# Patient Record
Sex: Female | Born: 1980 | Race: Black or African American | Hispanic: No | Marital: Single | Smoking: Never smoker
Health system: Southern US, Community
[De-identification: ages and names within clinical notes are randomized; demographics above are authoritative.]

## PROBLEM LIST (undated history)

## (undated) DIAGNOSIS — E669 Obesity, unspecified: Secondary | ICD-10-CM

## (undated) HISTORY — PX: WRIST SURGERY: SHX841

## (undated) HISTORY — PX: ANKLE SURGERY: SHX546

---
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
## 2003-01-21 ENCOUNTER — Emergency Department (HOSPITAL_COMMUNITY): Admission: EM | Admit: 2003-01-21 | Discharge: 2003-01-21 | Payer: Self-pay | Admitting: Emergency Medicine

## 2003-01-21 ENCOUNTER — Encounter: Payer: Self-pay | Admitting: Emergency Medicine

## 2003-08-05 ENCOUNTER — Emergency Department (HOSPITAL_COMMUNITY): Admission: EM | Admit: 2003-08-05 | Discharge: 2003-08-05 | Payer: Self-pay | Admitting: Emergency Medicine

## 2003-09-29 ENCOUNTER — Emergency Department (HOSPITAL_COMMUNITY): Admission: EM | Admit: 2003-09-29 | Discharge: 2003-09-30 | Payer: Self-pay | Admitting: Emergency Medicine

## 2003-10-01 ENCOUNTER — Inpatient Hospital Stay (HOSPITAL_COMMUNITY): Admission: EM | Admit: 2003-10-01 | Discharge: 2003-10-03 | Payer: Self-pay | Admitting: Emergency Medicine

## 2004-08-05 ENCOUNTER — Emergency Department (HOSPITAL_COMMUNITY): Admission: EM | Admit: 2004-08-05 | Discharge: 2004-08-05 | Payer: Self-pay | Admitting: Emergency Medicine

## 2004-08-21 ENCOUNTER — Encounter: Admission: RE | Admit: 2004-08-21 | Discharge: 2004-08-21 | Payer: Self-pay | Admitting: Gastroenterology

## 2005-01-12 ENCOUNTER — Emergency Department (HOSPITAL_COMMUNITY): Admission: EM | Admit: 2005-01-12 | Discharge: 2005-01-12 | Payer: Self-pay | Admitting: Emergency Medicine

## 2005-04-01 ENCOUNTER — Emergency Department (HOSPITAL_COMMUNITY): Admission: EM | Admit: 2005-04-01 | Discharge: 2005-04-01 | Payer: Self-pay | Admitting: Family Medicine

## 2006-03-30 ENCOUNTER — Emergency Department (HOSPITAL_COMMUNITY): Admission: EM | Admit: 2006-03-30 | Discharge: 2006-03-30 | Payer: Self-pay | Admitting: Emergency Medicine

## 2006-04-30 ENCOUNTER — Emergency Department (HOSPITAL_COMMUNITY): Admission: EM | Admit: 2006-04-30 | Discharge: 2006-04-30 | Payer: Self-pay | Admitting: Emergency Medicine

## 2006-05-25 ENCOUNTER — Emergency Department (HOSPITAL_COMMUNITY): Admission: EM | Admit: 2006-05-25 | Discharge: 2006-05-25 | Payer: Self-pay | Admitting: Emergency Medicine

## 2007-10-13 ENCOUNTER — Ambulatory Visit (HOSPITAL_COMMUNITY): Admission: RE | Admit: 2007-10-13 | Discharge: 2007-10-13 | Payer: Self-pay | Admitting: Obstetrics & Gynecology

## 2009-06-01 ENCOUNTER — Emergency Department (HOSPITAL_COMMUNITY): Admission: EM | Admit: 2009-06-01 | Discharge: 2009-06-01 | Payer: Self-pay | Admitting: Emergency Medicine

## 2009-10-21 ENCOUNTER — Emergency Department (HOSPITAL_COMMUNITY): Admission: EM | Admit: 2009-10-21 | Discharge: 2009-10-21 | Payer: Self-pay | Admitting: Emergency Medicine

## 2010-07-19 ENCOUNTER — Emergency Department (HOSPITAL_COMMUNITY)
Admission: EM | Admit: 2010-07-19 | Discharge: 2010-07-19 | Disposition: A | Discharge status: Home / Self Care | Payer: Medicaid Other | Attending: Emergency Medicine | Admitting: Emergency Medicine

## 2010-07-19 ENCOUNTER — Emergency Department (HOSPITAL_COMMUNITY): Payer: Medicaid Other

## 2010-07-19 DIAGNOSIS — R51 Headache: Secondary | ICD-10-CM | POA: Insufficient documentation

## 2010-07-19 DIAGNOSIS — J069 Acute upper respiratory infection, unspecified: Secondary | ICD-10-CM | POA: Insufficient documentation

## 2010-07-19 DIAGNOSIS — R059 Cough, unspecified: Secondary | ICD-10-CM | POA: Insufficient documentation

## 2010-07-19 DIAGNOSIS — R05 Cough: Secondary | ICD-10-CM | POA: Insufficient documentation

## 2010-07-28 ENCOUNTER — Emergency Department (HOSPITAL_COMMUNITY)
Admission: EM | Admit: 2010-07-28 | Discharge: 2010-07-29 | Disposition: A | Discharge status: Home / Self Care | Payer: Medicaid Other | Attending: Emergency Medicine | Admitting: Emergency Medicine

## 2010-07-28 ENCOUNTER — Emergency Department (HOSPITAL_COMMUNITY): Payer: Medicaid Other

## 2010-07-28 DIAGNOSIS — R062 Wheezing: Secondary | ICD-10-CM | POA: Insufficient documentation

## 2010-07-28 DIAGNOSIS — J40 Bronchitis, not specified as acute or chronic: Secondary | ICD-10-CM | POA: Insufficient documentation

## 2010-07-28 DIAGNOSIS — J069 Acute upper respiratory infection, unspecified: Secondary | ICD-10-CM | POA: Insufficient documentation

## 2010-08-12 LAB — URINALYSIS, ROUTINE W REFLEX MICROSCOPIC
Glucose, UA: NEGATIVE mg/dL
Hgb urine dipstick: NEGATIVE
Protein, ur: NEGATIVE mg/dL
Urobilinogen, UA: 1 mg/dL (ref 0.0–1.0)
pH: 6 (ref 5.0–8.0)

## 2010-08-12 LAB — URINE MICROSCOPIC-ADD ON

## 2010-10-11 NOTE — Discharge Summary (Signed)
Rachel Burke, Rachel Burke                          ACCOUNT NO.:  [id number]   MEDICAL RECORD NO.:  [id number]                   PATIENT TYPE:  INP   LOCATION:  0365                                 FACILITY:  [facility]   PHYSICIAN:  Melissa L. Ladona Ridgel, MD               DATE OF BIRTH:  March 25, 1981   DATE OF ADMISSION:  09/30/2003  DATE OF DISCHARGE:  10/03/2003                                 DISCHARGE SUMMARY   ADMITTING DIAGNOSES:  1. Back pain.  2. Dizziness.  3. Fever.   DISCHARGE DIAGNOSES:  1. Pyelonephritis, being treated with Cipro.  2. Vaginitis.   HISTORY OF PRESENT ILLNESS:  The patient is a 30 year old female with no  significant past medical history who presented to the emergency room on the  day prior to admission for back pain.  She was treated with oral Cipro for a  UTI; however, her pain increased, and she was unable to eat and became  nauseous.  She returned to the emergency room and was admitted for  pyelonephritis, which was determined on clinical examination, despite  negative CT for renal stone.  Of note, the patient does have an IUD in her  uterus.  She underwent a pelvic examination in the emergency room, and the  only finding was significant for clue cells with mild vaginal spotting.  The  patient states that  her period did discontinue several days prior to  becoming ill.  The patient was admitted to the general medical floor and  treated with IV Cipro and rehydrated.  She responded well to pain medication  and rehydration, and it was determined that her UTI was secondary to a  pansensitive E. coli.  Therefore, once the patient was able to take oral  antibiotics and oral medications, she was found stable for discharge to home  with follow up through her primary care physician.  She was given a  prescription for Cipro 500 mg p.o.  q.12h. to complete the course of her  medical therapy, and there were no other discharge needs at the time of  discharge.  Her condition  at discharge was stable.   LABORATORY DATA:  Pertinent laboratory values during the course of the  hospital revealed mild hypokalemia of 3.1, which was repleted.  Her  admitting white count was found to be 12.5.  The white count trended down to  7.9.  On the day prior to discharge, her hemoglobin and hematocrit were  stable at 10.6 and 31.7.  She underwent an ultrasound to rule out  hydronephrosis on the second day when her back pain did not improve, and  this was negative.   PHYSICAL EXAMINATION:  GENERAL:  Her physical exam remained relatively  benign.  Generally, she was in no acute distress.  HEENT:  Pupils equal, round and reactive to light.  Extraocular muscles were  intact.  CHEST:  Clear to auscultation.  CARDIOVASCULAR:  Regular  rate and rhythm.  Positive S1 and S2.  No S3 or S4.  ABDOMEN:  Soft, nontender, but she did have some right flank tenderness.  EXTREMITIES:  No edema.  She had 2+ pulses.  NEUROLOGIC:  She was nonfocal.   CONDITION AT DISCHARGE:  Stable.                                               Melissa L. Ladona Ridgel, MD    MLT/MEDQ  D:  10/14/2003  T:  10/14/2003  Job:  [id number]

## 2010-12-02 ENCOUNTER — Other Ambulatory Visit: Payer: Self-pay | Admitting: Family Medicine

## 2010-12-02 ENCOUNTER — Ambulatory Visit
Admission: RE | Admit: 2010-12-02 | Discharge: 2010-12-02 | Disposition: A | Discharge status: Home / Self Care | Payer: Medicaid Other | Source: Ambulatory Visit | Attending: Family Medicine | Admitting: Family Medicine

## 2010-12-02 DIAGNOSIS — R1032 Left lower quadrant pain: Secondary | ICD-10-CM

## 2010-12-04 ENCOUNTER — Other Ambulatory Visit: Payer: Self-pay | Admitting: Family Medicine

## 2010-12-04 DIAGNOSIS — R102 Pelvic and perineal pain: Secondary | ICD-10-CM

## 2010-12-06 ENCOUNTER — Ambulatory Visit
Admission: RE | Admit: 2010-12-06 | Discharge: 2010-12-06 | Disposition: A | Discharge status: Home / Self Care | Payer: Medicaid Other | Source: Ambulatory Visit | Attending: Family Medicine | Admitting: Family Medicine

## 2010-12-06 ENCOUNTER — Other Ambulatory Visit: Payer: Self-pay | Admitting: Family Medicine

## 2010-12-06 DIAGNOSIS — R102 Pelvic and perineal pain: Secondary | ICD-10-CM

## 2011-10-17 ENCOUNTER — Encounter (HOSPITAL_COMMUNITY): Payer: Self-pay | Admitting: Emergency Medicine

## 2011-10-17 ENCOUNTER — Emergency Department (HOSPITAL_COMMUNITY)
Admission: EM | Admit: 2011-10-17 | Discharge: 2011-10-17 | Disposition: A | Discharge status: Home Health | Payer: Medicaid Other | Attending: Emergency Medicine | Admitting: Emergency Medicine

## 2011-10-17 DIAGNOSIS — A5901 Trichomonal vulvovaginitis: Secondary | ICD-10-CM

## 2011-10-17 DIAGNOSIS — N72 Inflammatory disease of cervix uteri: Secondary | ICD-10-CM | POA: Insufficient documentation

## 2011-10-17 LAB — BASIC METABOLIC PANEL
BUN: 10 mg/dL (ref 6–23)
Calcium: 9.5 mg/dL (ref 8.4–10.5)
GFR calc non Af Amer: 89 mL/min — ABNORMAL LOW (ref >90)
Glucose, Bld: 85 mg/dL (ref 70–99)
Sodium: 138 mEq/L (ref 135–145)

## 2011-10-17 LAB — CBC
Hemoglobin: 12.3 g/dL (ref 12.0–15.0)
MCH: 26.6 pg (ref 26.0–34.0)
MCHC: 33.3 g/dL (ref 30.0–36.0)
MCV: 79.7 fL (ref 78.0–100.0)
Platelets: 348 10*3/uL (ref 150–400)
RDW: 15.1 % (ref 11.5–15.5)

## 2011-10-17 LAB — URINALYSIS, ROUTINE W REFLEX MICROSCOPIC
Bilirubin Urine: NEGATIVE
Glucose, UA: NEGATIVE mg/dL
Hgb urine dipstick: NEGATIVE
Nitrite: NEGATIVE
Urobilinogen, UA: 0.2 mg/dL (ref 0.0–1.0)
pH: 7.5 (ref 5.0–8.0)

## 2011-10-17 LAB — DIFFERENTIAL
Eosinophils Relative: 2 % (ref 0–5)
Lymphocytes Relative: 29 % (ref 12–46)
Lymphs Abs: 2.4 10*3/uL (ref 0.7–4.0)
Monocytes Relative: 10 % (ref 3–12)
Neutro Abs: 5 10*3/uL (ref 1.7–7.7)
Neutrophils Relative %: 59 % (ref 43–77)

## 2011-10-17 MED ORDER — HYDROCODONE-ACETAMINOPHEN 5-325 MG PO TABS: 1.0000 | ORAL_TABLET | Freq: Four times a day (QID) | ORAL | Status: DC | PRN

## 2011-10-17 MED ORDER — METRONIDAZOLE 500 MG PO TABS: 500.0000 mg | ORAL_TABLET | Freq: Two times a day (BID) | ORAL | Status: DC

## 2011-10-17 MED ORDER — SODIUM CHLORIDE 0.9 % IV SOLN: 1000.0000 mL | INTRAVENOUS | Status: DC

## 2011-10-17 MED ORDER — CEFTRIAXONE SODIUM 250 MG IJ SOLR
250.0000 mg | Freq: Once | INTRAMUSCULAR | Status: AC
  Administered 2011-10-17: 250 mg via INTRAMUSCULAR
  Filled 2011-10-17: qty 250

## 2011-10-17 MED ORDER — DOXYCYCLINE HYCLATE 100 MG PO CAPS: 100.0000 mg | ORAL_CAPSULE | Freq: Two times a day (BID) | ORAL | Status: DC

## 2011-10-17 MED ORDER — NAPROXEN 500 MG PO TABS: 500.0000 mg | ORAL_TABLET | Freq: Two times a day (BID) | ORAL | Status: DC

## 2011-10-17 MED ORDER — SODIUM CHLORIDE 0.9 % IV SOLN
1000.0000 mL | Freq: Once | INTRAVENOUS | Status: AC
  Administered 2011-10-17: 1000 mL via INTRAVENOUS

## 2011-10-17 NOTE — Discharge Instructions (Signed)
Cervicitis   Cervicitis is a soreness and swelling (inflammation) of the cervix. Your cervix is located at the bottom of your uterus which opens up to the vagina.   CAUSES   Sexually transmitted infections (STIs).   Allergic reaction.   Medicines or birth control devices that are put in the vagina.   Injury to the cervix.   Bacterial infections.   SYMPTOMS   There may be no symptoms. If symptoms occur, they may include:   Grey, white, yellow, or bad smelling vaginal discharge.   Pain or itching of the area outside the vagina.   Painful sexual intercourse.   Lower abdominal or lower back pain, especially during intercourse.   Frequent urination.   Abnormal vaginal bleeding between periods, after sexual intercourse, or after menopause.   Pressure or a heavy feeling in the pelvis.   DIAGNOSIS   Diagnosis is made after a pelvic exam. Other tests may include:   Examination of any discharge under a microscope (wet prep).   A Pap test.   TREATMENT   Treatment will depend on the cause of cervicitis. If it is caused by an STI, both you and your partner will need to be treated. Antibiotic medicines will be given.   HOME CARE INSTRUCTIONS   Do not have sexual intercourse until your caregiver says it is okay.   Do not have sexual intercourse until your partner has been treated if your cervicitis is caused by an STI.   Take your antibiotics as directed. Finish them even if you start to feel better.   SEEK IMMEDIATE MEDICAL CARE IF:   Your symptoms come back.   You have a fever.   You experience any problems that may be related to the medicine you are taking.   MAKE SURE YOU:   Understand these instructions.   Will watch your condition.   Will get help right away if you are not doing well or get worse.   Document Released: 05/12/2005 Document Revised: 05/01/2011 Document Reviewed: 12/09/2010   Exit[employer] Patient Information 2012 [employer].

## 2011-10-17 NOTE — ED Notes (Signed)
Pt of LLQ beginning 2 days ago, pain does not move and is described as sharp-shooting pain increases with movement. No nausea, vomiting or diarrhea.

## 2011-10-17 NOTE — ED Provider Notes (Signed)
History     CSN: [id number]  Arrival date & time 10/17/11  0930   First MD Initiated Contact with Patient 10/17/11 [phone]      No chief complaint on file.   (Consider location/radiation/quality/duration/timing/severity/associated sxs/prior treatment) Patient is a 31 y.o. female presenting with abdominal pain. The history is provided by the patient.  Abdominal Pain The primary symptoms of the illness include abdominal pain and dysuria. The primary symptoms of the illness do not include fever, nausea, vomiting, diarrhea, vaginal discharge or vaginal bleeding. The current episode started 2 days ago. The onset of the illness was gradual. The problem has not changed since onset. The abdominal pain is located in the LLQ. The abdominal pain radiates to the RLQ. Pain scale: moderate but at times will be more severe, waxing and waning.  The patient states that she believes she is currently not pregnant. Symptoms associated with the illness do not include chills.    No past medical history on file.  No past surgical history on file.  No family history on file.  History  Substance Use Topics  . Smoking status: Not on file  . Smokeless tobacco: Not on file  . Alcohol Use: Not on file    OB History    No data available      Review of Systems  Constitutional: Negative for fever and chills.  Gastrointestinal: Positive for abdominal pain. Negative for nausea, vomiting and diarrhea.  Genitourinary: Positive for dysuria. Negative for vaginal bleeding and vaginal discharge.  All other systems reviewed and are negative.    Allergies  Review of patient's allergies indicates not on file.  Home Medications  No current outpatient prescriptions on file.  There were no vitals taken for this visit.  Physical Exam  Nursing note and vitals reviewed. Constitutional: She appears well-developed and well-nourished. No distress.  HENT:  Head: Normocephalic and atraumatic.  Right Ear: External  ear normal.  Left Ear: External ear normal.  Eyes: Conjunctivae are normal. Right eye exhibits no discharge. Left eye exhibits no discharge. No scleral icterus.  Neck: Neck supple. No tracheal deviation present.  Cardiovascular: Normal rate, regular rhythm and intact distal pulses.   Pulmonary/Chest: Effort normal and breath sounds normal. No stridor. No respiratory distress. She has no wheezes. She has no rales.  Abdominal: Soft. Bowel sounds are normal. She exhibits no distension. There is no tenderness. There is no rebound and no guarding.  Genitourinary: Uterus is tender. Cervix exhibits motion tenderness. Left adnexum displays tenderness. Left adnexum displays no mass and no fullness. No bleeding around the vagina. Vaginal discharge found.  Musculoskeletal: She exhibits no edema and no tenderness.  Neurological: She is alert. She has normal strength. No sensory deficit. Cranial nerve deficit:  no gross defecits noted. She exhibits normal muscle tone. She displays no seizure activity. Coordination normal.  Skin: Skin is warm and dry. No rash noted.  Psychiatric: She has a normal mood and affect.    ED Course  Procedures (including critical care time)  Labs Reviewed  URINALYSIS, ROUTINE W REFLEX MICROSCOPIC - Abnormal; Notable for the following:    Leukocytes, UA TRACE (*)    All other components within normal limits  BASIC METABOLIC PANEL - Abnormal; Notable for the following:    GFR calc non Af Amer 89 (*)    All other components within normal limits  URINE MICROSCOPIC-ADD ON - Abnormal; Notable for the following:    Bacteria, UA MANY (*)    All other components within  normal limits  CBC  DIFFERENTIAL  POCT PREGNANCY, URINE  GC/CHLAMYDIA PROBE AMP, GENITAL   No results found.   MDM   Pt with cervicitis.  Will dc home with PO abx.  Precautions discussed.       Celene Kras, MD 10/17/11 [phone]

## 2011-10-18 LAB — GC/CHLAMYDIA PROBE AMP, GENITAL
Chlamydia, DNA Probe: NEGATIVE
GC Probe Amp, Genital: NEGATIVE

## 2011-10-23 ENCOUNTER — Emergency Department (HOSPITAL_COMMUNITY)
Admission: EM | Admit: 2011-10-23 | Discharge: 2011-10-23 | Disposition: A | Discharge status: Home / Self Care | Payer: Medicaid Other | Source: Home / Self Care | Attending: Emergency Medicine | Admitting: Emergency Medicine

## 2011-10-23 ENCOUNTER — Encounter (HOSPITAL_COMMUNITY): Payer: Self-pay | Admitting: Emergency Medicine

## 2011-10-23 DIAGNOSIS — K208 Other esophagitis without bleeding: Secondary | ICD-10-CM

## 2011-10-23 HISTORY — DX: Obesity, unspecified: E66.9

## 2011-10-23 MED ORDER — GI COCKTAIL ~~LOC~~
ORAL | Status: AC
  Filled 2011-10-23: qty 30

## 2011-10-23 MED ORDER — GI COCKTAIL ~~LOC~~
30.0000 mL | Freq: Once | ORAL | Status: AC
  Administered 2011-10-23: 30 mL via ORAL

## 2011-10-23 MED ORDER — ALUM & MAG HYDROXIDE-SIMETH 200-200-20 MG/5ML PO SUSP: 5.0000 mL | Freq: Four times a day (QID) | ORAL | Status: AC | PRN

## 2011-10-23 MED ORDER — SUCRALFATE 1 GM/10ML PO SUSP: 1.0000 g | Freq: Four times a day (QID) | ORAL | Status: AC

## 2011-10-23 MED ORDER — DIPHENHYDRAMINE HCL 12.5 MG/5ML PO LIQD: 12.5000 mg | Freq: Four times a day (QID) | ORAL | Status: AC | PRN

## 2011-10-23 NOTE — ED Notes (Signed)
Patient reports seen at [facility] long ed on 5/24.2013 and diagnoses with cervicitis.  Discharged with 2 antibiotics.  Patient reports 2 days ago noticing discomfort in center chest, through to back.  Denies any choking episodes, described as "trouble swallowing".  Pain is dull with swallowing saliva, but sharp pain with swallowing food or liquids.  Patient also reports a red tinted vaginal discharge.  Says she stopped antibiotics 2 days ago, discharge stopped, sensation in chest has not improved.

## 2011-10-23 NOTE — ED Provider Notes (Signed)
History     CSN: [id number]  Arrival date & time 10/23/11  1341   First MD Initiated Contact with Patient 10/23/11 1404      Chief Complaint  Patient presents with  . Dysphagia    (Consider location/radiation/quality/duration/timing/severity/associated sxs/prior treatment) HPI Comments: Patient presents with dysphagia starting 2 days ago. Reports sharp pain when swallowing. Otherwise complains of a dull, burning sensation when swallowing saliva. States that solids hurt more than liquids, but that she has pain with swallowing all the time. No nausea, vomiting, voice changes, regurgitation, drooling. No other CP, SOB, abdominal pain. She was seen on 5/24 with abdominal pain and dysuria. She was thought to have cervicitis. Gonorrhea, Chlamydia were negative. She sent home on doxycycline, Flagyl, NSAIDs, Norco. States that was taking the doxycycline and Flagyl but stopped 2 days ago. She states that she is not gotten any better since she stopped taking the pills. Patient has no known history of reflux. She is not a smoker. She is obese.  ROS as noted in HPI. All other ROS negative.   The history is provided by the patient. No language interpreter was used.    Past Medical History  Diagnosis Date  . Obesity     Past Surgical History  Procedure Date  . Cesarean section     Family History  Problem Relation Age of Onset  . Cancer Mother   . Hypertension Mother   . Thyroid disease Mother   . Asthma Father     History  Substance Use Topics  . Smoking status: Never Smoker   . Smokeless tobacco: Never Used  . Alcohol Use: No    OB History    Grav Para Term Preterm Abortions TAB SAB Ect Mult Living                  Review of Systems  Allergies  Review of patient's allergies indicates no known allergies.  Home Medications   Current Outpatient Rx  Name Route Sig Dispense Refill  . ALUM & MAG HYDROXIDE-SIMETH 200-200-20 MG/5ML PO SUSP Oral Take 5 mLs by mouth 4 (four)  times daily as needed. Mix 5 mL with 5 mL of benadryl. Take 4-6 hr prn pain 150 mL 0  . DIPHENHYDRAMINE HCL 12.5 MG/5ML PO LIQD Oral Take 5 mLs (12.5 mg total) by mouth 4 (four) times daily as needed. Mix 5 mL with 5 mL of Maalox. Hold in mouth and swallow. Take the 10 mL q 4-6 hr prn pain 120 mL 0  . SUCRALFATE 1 GM/10ML PO SUSP Oral Take 10 mLs (1 g total) by mouth 4 (four) times daily. 10 mL before meals and before bedtime 240 mL 0    BP 115/49  Pulse 86  Temp(Src) 97.8 F (36.6 C) (Oral)  Resp 18  SpO2 97%  LMP 09/24/2011  Physical Exam  Nursing note and vitals reviewed. Constitutional: She is oriented to person, place, and time. She appears well-developed and well-nourished. No distress.  HENT:  Head: Normocephalic and atraumatic.  Mouth/Throat: Oropharynx is clear and moist.  Eyes: Conjunctivae and EOM are normal.  Neck: Normal range of motion.  Cardiovascular: Normal rate, regular rhythm and normal heart sounds.   Pulmonary/Chest: Effort normal and breath sounds normal.  Abdominal: Soft. Bowel sounds are normal. She exhibits no distension. There is no tenderness.  Musculoskeletal: Normal range of motion.  Neurological: She is alert and oriented to person, place, and time.  Skin: Skin is warm and dry.  Psychiatric: She has  a normal mood and affect. Her behavior is normal. Judgment and thought content normal.    ED Course  Procedures (including critical care time)  Labs Reviewed - No data to display No results found.   1. Pill esophagitis       MDM  Previous records, labs reviewed. GC chalmhydia neg.  advised that it was ok to stop the doxycycline. No wet prep available but the patient states that she has no further vaginal discharge. H& P most c/w pill espohagitis. Gave GI cocktail here. Will send home with Benadryl/Maalox mix, and Carafate. Discussed that it may take several days for her to fully recover. She will followup with her primary care physician as  needed.  Luiz Blare, MD 10/23/11 1538

## 2011-10-23 NOTE — Discharge Instructions (Signed)
Take the medication as written. Return for fever >100.4, if your pain changes, or for any other concerns.

## 2013-04-14 ENCOUNTER — Ambulatory Visit: Payer: Self-pay | Admitting: Obstetrics & Gynecology

## 2013-05-05 ENCOUNTER — Ambulatory Visit: Payer: Medicaid Other | Admitting: Obstetrics & Gynecology

## 2014-05-22 ENCOUNTER — Encounter: Payer: Self-pay | Admitting: *Deleted

## 2016-04-10 ENCOUNTER — Other Ambulatory Visit: Payer: Medicaid Other

## 2016-04-10 ENCOUNTER — Other Ambulatory Visit: Payer: Self-pay | Admitting: Family Medicine

## 2016-04-10 ENCOUNTER — Ambulatory Visit
Admission: RE | Admit: 2016-04-10 | Discharge: 2016-04-10 | Disposition: A | Discharge status: Home / Self Care | Payer: BLUE CROSS/BLUE SHIELD | Source: Ambulatory Visit | Attending: Family Medicine | Admitting: Family Medicine

## 2016-04-10 DIAGNOSIS — R52 Pain, unspecified: Secondary | ICD-10-CM

## 2016-04-10 DIAGNOSIS — R1011 Right upper quadrant pain: Secondary | ICD-10-CM

## 2016-04-10 DIAGNOSIS — R0602 Shortness of breath: Secondary | ICD-10-CM

## 2016-04-28 ENCOUNTER — Emergency Department (HOSPITAL_COMMUNITY)
Admission: EM | Admit: 2016-04-28 | Discharge: 2016-04-29 | Disposition: A | Discharge status: Home / Self Care | Payer: BLUE CROSS/BLUE SHIELD | Attending: Emergency Medicine | Admitting: Emergency Medicine

## 2016-04-28 ENCOUNTER — Encounter (HOSPITAL_COMMUNITY): Payer: Self-pay | Admitting: Emergency Medicine

## 2016-04-28 DIAGNOSIS — Z79899 Other long term (current) drug therapy: Secondary | ICD-10-CM | POA: Insufficient documentation

## 2016-04-28 DIAGNOSIS — R1011 Right upper quadrant pain: Secondary | ICD-10-CM | POA: Diagnosis not present

## 2016-04-28 DIAGNOSIS — R109 Unspecified abdominal pain: Secondary | ICD-10-CM

## 2016-04-28 LAB — COMPREHENSIVE METABOLIC PANEL
ALBUMIN: 3.8 g/dL (ref 3.5–5.0)
ALK PHOS: 66 U/L (ref 38–126)
ALT: 13 U/L — AB (ref 14–54)
AST: 16 U/L (ref 15–41)
Anion gap: 8 (ref 5–15)
BUN: 11 mg/dL (ref 6–20)
CALCIUM: 8.6 mg/dL — AB (ref 8.9–10.3)
CO2: 25 mmol/L (ref 22–32)
CREATININE: 0.82 mg/dL (ref 0.44–1.00)
Chloride: 108 mmol/L (ref 101–111)
GFR calc Af Amer: >60 mL/min (ref >60)
GFR calc non Af Amer: >60 mL/min (ref >60)
GLUCOSE: 99 mg/dL (ref 65–99)
Potassium: 3.7 mmol/L (ref 3.5–5.1)
SODIUM: 141 mmol/L (ref 135–145)
Total Bilirubin: 0.8 mg/dL (ref 0.3–1.2)
Total Protein: 7.1 g/dL (ref 6.5–8.1)

## 2016-04-28 LAB — I-STAT BETA HCG BLOOD, ED (MC, WL, AP ONLY)

## 2016-04-28 LAB — URINALYSIS, ROUTINE W REFLEX MICROSCOPIC
BILIRUBIN URINE: NEGATIVE
GLUCOSE, UA: NEGATIVE mg/dL
HGB URINE DIPSTICK: NEGATIVE
Ketones, ur: NEGATIVE mg/dL
Leukocytes, UA: NEGATIVE
Nitrite: NEGATIVE
PROTEIN: NEGATIVE mg/dL
SPECIFIC GRAVITY, URINE: 1.012 (ref 1.005–1.030)
pH: 6.5 (ref 5.0–8.0)

## 2016-04-28 LAB — CBC
HCT: 35.2 % — ABNORMAL LOW (ref 36.0–46.0)
Hemoglobin: 11.6 g/dL — ABNORMAL LOW (ref 12.0–15.0)
MCH: 26.9 pg (ref 26.0–34.0)
MCHC: 33 g/dL (ref 30.0–36.0)
MCV: 81.7 fL (ref 78.0–100.0)
PLATELETS: 355 10*3/uL (ref 150–400)
RBC: 4.31 MIL/uL (ref 3.87–5.11)
RDW: 15.2 % (ref 11.5–15.5)
WBC: 7 10*3/uL (ref 4.0–10.5)

## 2016-04-28 LAB — LIPASE, BLOOD: Lipase: 32 U/L (ref 11–51)

## 2016-04-28 MED ORDER — DICYCLOMINE HCL 10 MG PO CAPS
10.0000 mg | ORAL_CAPSULE | Freq: Once | ORAL | Status: AC
  Administered 2016-04-28: 10 mg via ORAL
  Filled 2016-04-28: qty 1

## 2016-04-28 NOTE — ED Triage Notes (Signed)
Pt presents with right sided abdominal pain that began 3 weeks ago.  She was seen by her PCP and urgent care at that time and was prescribed naproxen, tylenol 3, antibiotics, and a muscle relaxer and she had some relief.  They did x-rays and stated there was some spots that could be indicative of pneumonia so they wanted to give her antibiotics prophylactic. Since running out pain has returned.  Denies N&V or diarrhea. A&O.

## 2016-04-28 NOTE — ED Provider Notes (Signed)
[facility]-EMERGENCY DEPT Provider Note   CSN: [id number] Arrival date & time: 04/28/16  2004     History   Chief Complaint Chief Complaint  Patient presents with  . Abdominal Pain    HPI Rachel Burke is a 35 y.o. female.  The history is provided by the patient. No language interpreter was used.  Abdominal Pain      Rachel Burke is a 35 y.o. female who presents to the Emergency Department complaining of abdominal pain.  She initially developed right sided abdominal pain just before thanksgiving.  Pain is sharp and worse with movement. Sxs were present for about one week at that time and she saw her PCP and had an xray performed and was treated with naproxen, T3, zpackFor questionable pneumonia.  She was well for a week only to experience recurrent sxs three days ago.  She denies fever, cough, vomiting, dysuria, discharge, diarrhea, constipation, leg swelling.  Her abdominal pain is worse with deep breaths. She does not take any exogenous estrogen.  Past Medical History:  Diagnosis Date  . Obesity     There are no active problems to display for this patient.   Past Surgical History:  Procedure Laterality Date  . CESAREAN SECTION      OB History    No data available       Home Medications    Prior to Admission medications   Medication Sig Start Date End Date Taking? Authorizing Provider  diphenhydrAMINE (BENADRYL) 12.5 MG/5ML liquid Take 5 mLs (12.5 mg total) by mouth 4 (four) times daily as needed. Mix 5 mL with 5 mL of Maalox. Hold in mouth and swallow. Take the 10 mL q 4-6 hr prn pain 10/23/11 11/02/11  Domenick GongAshley Mortenson, MD  ibuprofen (ADVIL,MOTRIN) 800 MG tablet Take 1 tablet (800 mg total) by mouth 3 (three) times daily. 04/29/16   Tilden FossaElizabeth Alexx Giambra, MD  sucralfate (CARAFATE) 1 GM/10ML suspension Take 10 mLs (1 g total) by mouth 4 (four) times daily. 10 mL before meals and before bedtime 10/23/11 11/22/11  Domenick GongAshley Mortenson, MD    Family History Family History  Problem  Relation Age of Onset  . Cancer Mother   . Hypertension Mother   . Thyroid disease Mother   . Asthma Father     Social History Social History  Substance Use Topics  . Smoking status: Never Smoker  . Smokeless tobacco: Never Used  . Alcohol use No     Allergies   Patient has no known allergies.   Review of Systems Review of Systems  Gastrointestinal: Positive for abdominal pain.  All other systems reviewed and are negative.    Physical Exam Updated Vital Signs BP 100/62 (BP Location: Right Arm) Comment (BP Location): Lower   Pulse 64   Temp 98 F (36.7 C) (Oral)   Resp 18   Ht 5\' 5"  (1.651 m)   Wt (!) 310 lb (140.6 kg)   LMP 04/16/2016   SpO2 100%   BMI 51.59 kg/m   Physical Exam  Constitutional: She is oriented to person, place, and time. She appears well-developed and well-nourished.  HENT:  Head: Normocephalic and atraumatic.  Cardiovascular: Normal rate and regular rhythm.   No murmur heard. Pulmonary/Chest: Effort normal and breath sounds normal. No respiratory distress.  Abdominal: Soft. There is no rebound and no guarding.  Mild to moderate RUQ tenderness  Musculoskeletal: She exhibits no edema or tenderness.  Neurological: She is alert and oriented to person, place, and time.  Skin: Skin is  warm and dry.  Psychiatric: She has a normal mood and affect. Her behavior is normal.  Nursing note and vitals reviewed.    ED Treatments / Results  Labs (all labs ordered are listed, but only abnormal results are displayed) Labs Reviewed  COMPREHENSIVE METABOLIC PANEL - Abnormal; Notable for the following:       Result Value   Calcium 8.6 (*)    ALT 13 (*)    All other components within normal limits  CBC - Abnormal; Notable for the following:    Hemoglobin 11.6 (*)    HCT 35.2 (*)    All other components within normal limits  LIPASE, BLOOD  URINALYSIS, ROUTINE W REFLEX MICROSCOPIC (NOT AT [facility])  I-STAT BETA HCG BLOOD, ED ([facility], [facility], [facility] ONLY)     EKG  EKG Interpretation None       Radiology [place] Abdomen Limited Ruq  Result Date: 04/29/2016 CLINICAL DATA:  Right-sided abdominal pain for 3 weeks EXAM: US ABDOMEN LIMITED - RIGHT UPPER QUADRANT COMPARISON:  None. FINDINGS: Gallbladder: No gallstones or wall thickening visualized. No sonographic Murphy sign noted by sonographer. Common bile duct: Diameter: 3.7 mm Liver: No focal lesion identified. Within normal limits in parenchymal echogenicity. IMPRESSION: Normal liver, gallbladder and bile ducts. Electronically Signed   By: Ellery Plunkaniel R Mitchell M.D.   On: 04/29/2016 01:59    Procedures Procedures (including critical care time)  Medications Ordered in ED Medications  dicyclomine (BENTYL) capsule 10 mg (10 mg Oral Given 04/28/16 2321)  ketorolac (TORADOL) injection 60 mg (60 mg Intramuscular Given 04/29/16 0211)     Initial Impression / Assessment and Plan / ED Course  I have reviewed the triage vital signs and the nursing notes.  Pertinent labs & imaging results that were available during my care of the patient were reviewed by me and considered in my medical decision making (see chart for details).  Clinical Course     Patient here for evaluation of right-sided abdominal pain, she is tender in the right upper quadrant. Presentation is not consistent with pneumonia, appendicitis, cholecystitis, PE. Patient is perk negative. Discussed with patient home care for likely abdominal wall pain with anti-inflammatories. Discussed outpatient follow-up and recommendations.  Final Clinical Impressions(s) / ED Diagnoses   Final diagnoses:  Abdominal pain  Right upper quadrant abdominal pain    New Prescriptions Discharge Medication List as of 04/29/2016  2:10 AM    START taking these medications   Details  ibuprofen (ADVIL,MOTRIN) 800 MG tablet Take 1 tablet (800 mg total) by mouth 3 (three) times daily., Starting Tue 04/29/2016, Print         Tilden FossaElizabeth Loreal Schuessler, MD 04/29/16  [phone]

## 2016-04-29 ENCOUNTER — Emergency Department (HOSPITAL_COMMUNITY): Payer: BLUE CROSS/BLUE SHIELD

## 2016-04-29 MED ORDER — KETOROLAC TROMETHAMINE 60 MG/2ML IM SOLN
60.0000 mg | Freq: Once | INTRAMUSCULAR | Status: AC
  Administered 2016-04-29: 60 mg via INTRAMUSCULAR
  Filled 2016-04-29: qty 2

## 2016-04-29 MED ORDER — IBUPROFEN 800 MG PO TABS: 800.0000 mg | ORAL_TABLET | Freq: Three times a day (TID) | ORAL | 0 refills | Status: DC

## 2016-04-29 NOTE — ED Notes (Signed)
Pt transported to ultrasound.

## 2021-01-12 ENCOUNTER — Emergency Department (HOSPITAL_COMMUNITY): Payer: Medicaid Other

## 2021-01-12 ENCOUNTER — Encounter (HOSPITAL_COMMUNITY): Payer: Self-pay

## 2021-01-12 ENCOUNTER — Other Ambulatory Visit: Payer: Self-pay

## 2021-01-12 ENCOUNTER — Emergency Department (HOSPITAL_COMMUNITY)
Admission: EM | Admit: 2021-01-12 | Discharge: 2021-01-12 | Disposition: A | Discharge status: Home / Self Care | Payer: Medicaid Other | Attending: Emergency Medicine | Admitting: Emergency Medicine

## 2021-01-12 DIAGNOSIS — B9689 Other specified bacterial agents as the cause of diseases classified elsewhere: Secondary | ICD-10-CM | POA: Insufficient documentation

## 2021-01-12 DIAGNOSIS — N3001 Acute cystitis with hematuria: Secondary | ICD-10-CM | POA: Insufficient documentation

## 2021-01-12 DIAGNOSIS — N9489 Other specified conditions associated with female genital organs and menstrual cycle: Secondary | ICD-10-CM | POA: Diagnosis not present

## 2021-01-12 DIAGNOSIS — R109 Unspecified abdominal pain: Secondary | ICD-10-CM | POA: Diagnosis present

## 2021-01-12 LAB — CBC WITH DIFFERENTIAL/PLATELET
Abs Immature Granulocytes: 0.02 10*3/uL (ref 0.00–0.07)
Basophils Absolute: 0 10*3/uL (ref 0.0–0.1)
Basophils Relative: 0 %
Eosinophils Absolute: 0.4 10*3/uL (ref 0.0–0.5)
Eosinophils Relative: 5 %
HCT: 35.4 % — ABNORMAL LOW (ref 36.0–46.0)
Hemoglobin: 11.4 g/dL — ABNORMAL LOW (ref 12.0–15.0)
Immature Granulocytes: 0 %
Lymphocytes Relative: 44 %
Lymphs Abs: 3.4 10*3/uL (ref 0.7–4.0)
MCH: 25.4 pg — ABNORMAL LOW (ref 26.0–34.0)
MCHC: 32.2 g/dL (ref 30.0–36.0)
MCV: 78.8 fL — ABNORMAL LOW (ref 80.0–100.0)
Monocytes Absolute: 0.6 10*3/uL (ref 0.1–1.0)
Monocytes Relative: 8 %
Neutro Abs: 3.5 10*3/uL (ref 1.7–7.7)
Neutrophils Relative %: 43 %
Platelets: 383 10*3/uL (ref 150–400)
RBC: 4.49 MIL/uL (ref 3.87–5.11)
RDW: 17.2 % — ABNORMAL HIGH (ref 11.5–15.5)
WBC: 8 10*3/uL (ref 4.0–10.5)
nRBC: 0 % (ref 0.0–0.2)

## 2021-01-12 LAB — URINALYSIS, ROUTINE W REFLEX MICROSCOPIC
Bilirubin Urine: NEGATIVE
Glucose, UA: NEGATIVE mg/dL
Ketones, ur: NEGATIVE mg/dL
Nitrite: NEGATIVE
Protein, ur: 100 mg/dL — AB
RBC / HPF: >50 RBC/hpf — ABNORMAL HIGH (ref 0–5)
Specific Gravity, Urine: 1.013 (ref 1.005–1.030)
pH: 6 (ref 5.0–8.0)

## 2021-01-12 LAB — COMPREHENSIVE METABOLIC PANEL
ALT: 13 U/L (ref 0–44)
AST: 15 U/L (ref 15–41)
Albumin: 3.6 g/dL (ref 3.5–5.0)
Alkaline Phosphatase: 74 U/L (ref 38–126)
Anion gap: 7 (ref 5–15)
BUN: 11 mg/dL (ref 6–20)
CO2: 26 mmol/L (ref 22–32)
Calcium: 9.1 mg/dL (ref 8.9–10.3)
Chloride: 108 mmol/L (ref 98–111)
Creatinine, Ser: 0.92 mg/dL (ref 0.44–1.00)
GFR, Estimated: >60 mL/min (ref >60)
Glucose, Bld: 90 mg/dL (ref 70–99)
Potassium: 4.1 mmol/L (ref 3.5–5.1)
Sodium: 141 mmol/L (ref 135–145)
Total Bilirubin: 0.5 mg/dL (ref 0.3–1.2)
Total Protein: 7.5 g/dL (ref 6.5–8.1)

## 2021-01-12 LAB — PREGNANCY, URINE: Preg Test, Ur: NEGATIVE

## 2021-01-12 LAB — I-STAT BETA HCG BLOOD, ED (MC, WL, AP ONLY): I-stat hCG, quantitative: <5 m[IU]/mL (ref <5)

## 2021-01-12 MED ORDER — SODIUM CHLORIDE 0.9 % IV SOLN
2.0000 g | Freq: Once | INTRAVENOUS | Status: AC
  Administered 2021-01-12: 2 g via INTRAVENOUS
  Filled 2021-01-12: qty 20

## 2021-01-12 MED ORDER — IBUPROFEN 800 MG PO TABS
800.0000 mg | ORAL_TABLET | Freq: Three times a day (TID) | ORAL | 0 refills | Status: DC | PRN
Start: 2021-01-12 — End: 2023-05-08

## 2021-01-12 MED ORDER — CEPHALEXIN 500 MG PO CAPS: ORAL_CAPSULE | ORAL | 0 refills | Status: DC

## 2021-01-12 NOTE — ED Provider Notes (Signed)
Emergency Medicine Provider Triage Evaluation Note  Rachel Burke , a 40 y.o. female  was evaluated in triage.  Pt complains of left-sided flank pain, hematuria, and dysuria x3 days.  Patient reports that she went to urgent care earlier today and was told there was protein in her urine to come to the emergency department for further evaluation.  Patient denies any difficulty urinating, fevers, chills, nausea, vomiting.  Review of Systems  Positive: Flank pain, hematuria, dysuria Negative: difficulty urinating, fevers, chills, nausea, vomiting  Physical Exam  BP 139/86 (BP Location: Left Arm)   Pulse 78   Temp 98.5 F (36.9 C) (Oral)   Resp 18   SpO2 100%  Gen:   Awake, no distress   Resp:  Normal effort  MSK:   Moves extremities without difficulty  Other:  Abdomen soft, nondistended, nontender, left CVA tenderness.  Medical Decision Making  Medically screening exam initiated at 3:24 PM.  Appropriate orders placed.  Rachel Burke was informed that the remainder of the evaluation will be completed by another provider, this initial triage assessment does not replace that evaluation, and the importance of remaining in the ED until their evaluation is complete.  The patient appears stable so that the remainder of the work up may be completed by another provider.      Haskel Schroeder, PA-C 01/12/21 1526    Bethann Berkshire, MD 01/14/21 1534

## 2021-01-12 NOTE — Discharge Instructions (Addendum)
Follow up with your family md this week °

## 2021-01-12 NOTE — ED Triage Notes (Signed)
Pt arrived via POV, c/o left sided flank pain, painful urination, blood in urine x3 days. Was seen at [facility], told  there was protein in urine and to go to ED for CT.

## 2021-01-12 NOTE — ED Notes (Signed)
Patient was called for lab work but no response.

## 2021-01-12 NOTE — ED Provider Notes (Signed)
[facility]-EMERGENCY DEPT Provider Note   CSN: [id number] Arrival date & time: 01/12/21  1022     History Chief Complaint  Patient presents with   Flank Pain    Rachel Burke is a 40 y.o. female.  Patient complains of left flank pain.  No fever no chills  The history is provided by the patient and medical records.  Flank Pain This is a new problem. The current episode started 6 to 12 hours ago. The problem occurs constantly. The problem has not changed since onset.Pertinent negatives include no chest pain, no abdominal pain and no headaches. Nothing aggravates the symptoms. Nothing relieves the symptoms. She has tried nothing for the symptoms.      Past Medical History:  Diagnosis Date   Obesity     There are no problems to display for this patient.   Past Surgical History:  Procedure Laterality Date   CESAREAN SECTION       OB History   No obstetric history on file.     Family History  Problem Relation Age of Onset   Cancer Mother    Hypertension Mother    Thyroid disease Mother    Asthma Father     Social History   Tobacco Use   Smoking status: Never   Smokeless tobacco: Never  Substance Use Topics   Alcohol use: No   Drug use: No    Home Medications Prior to Admission medications   Medication Sig Start Date End Date Taking? Authorizing Provider  cephALEXin (KEFLEX) 500 MG capsule Take one pill qid 01/12/21  Yes Bethann Berkshire, MD  gabapentin (NEURONTIN) 100 MG capsule Take 100 mg by mouth 3 (three) times daily as needed. 04/30/20  Yes [provider]  ibuprofen (ADVIL) 800 MG tablet Take 1 tablet (800 mg total) by mouth every 8 (eight) hours as needed for moderate pain. 01/12/21  Yes Bethann Berkshire, MD  oxyCODONE-acetaminophen (PERCOCET/ROXICET) 5-325 MG tablet Take 1 tablet by mouth every 6 (six) hours as needed for moderate pain or severe pain. 08/16/20  Yes [provider]  traMADol (ULTRAM) 50 MG tablet Take  50 mg by mouth every 6 (six) hours as needed for moderate pain or severe pain. 12/11/20  Yes [provider]  diphenhydrAMINE (BENADRYL) 12.5 MG/5ML liquid Take 5 mLs (12.5 mg total) by mouth 4 (four) times daily as needed. Mix 5 mL with 5 mL of Maalox. Hold in mouth and swallow. Take the 10 mL q 4-6 hr prn pain 10/23/11 11/02/11  Domenick Gong, MD  doxycycline (VIBRAMYCIN) 100 MG capsule Take 100 mg by mouth 2 (two) times daily. 01/09/21   [provider]  sucralfate (CARAFATE) 1 GM/10ML suspension Take 10 mLs (1 g total) by mouth 4 (four) times daily. 10 mL before meals and before bedtime 10/23/11 11/22/11  Domenick Gong, MD    Allergies    Patient has no known allergies.  Review of Systems   Review of Systems  Constitutional:  Negative for appetite change and fatigue.  HENT:  Negative for congestion, ear discharge and sinus pressure.   Eyes:  Negative for discharge.  Respiratory:  Negative for cough.   Cardiovascular:  Negative for chest pain.  Gastrointestinal:  Negative for abdominal pain and diarrhea.  Genitourinary:  Positive for flank pain. Negative for frequency and hematuria.  Musculoskeletal:  Negative for back pain.  Skin:  Negative for rash.  Neurological:  Negative for seizures and headaches.  Psychiatric/Behavioral:  Negative for hallucinations.  Physical Exam Updated Vital Signs BP (!) 147/74   Pulse 70   Temp 98.5 F (36.9 C) (Oral)   Resp 20   SpO2 99%   Physical Exam Vitals and nursing note reviewed.  Constitutional:      Appearance: She is well-developed.  HENT:     Head: Normocephalic.     Nose: Nose normal.  Eyes:     General: No scleral icterus.    Conjunctiva/sclera: Conjunctivae normal.  Neck:     Thyroid: No thyromegaly.  Cardiovascular:     Rate and Rhythm: Normal rate and regular rhythm.     Heart sounds: No murmur heard.   No friction rub. No gallop.  Pulmonary:     Breath sounds: No stridor. No wheezing or rales.   Chest:     Chest wall: No tenderness.  Abdominal:     General: There is no distension.     Tenderness: There is no abdominal tenderness. There is no rebound.  Genitourinary:    Comments: Tender left flank Musculoskeletal:        General: Normal range of motion.     Cervical back: Neck supple.  Lymphadenopathy:     Cervical: No cervical adenopathy.  Skin:    Findings: No erythema or rash.  Neurological:     Mental Status: She is alert and oriented to person, place, and time.     Motor: No abnormal muscle tone.     Coordination: Coordination normal.  Psychiatric:        Behavior: Behavior normal.    ED Results / Procedures / Treatments   Labs (all labs ordered are listed, but only abnormal results are displayed) Labs Reviewed  URINALYSIS, ROUTINE W REFLEX MICROSCOPIC - Abnormal; Notable for the following components:      Result Value   APPearance CLOUDY (*)    Hgb urine dipstick LARGE (*)    Protein, ur 100 (*)    Leukocytes,Ua SMALL (*)    RBC / HPF >50 (*)    Bacteria, UA RARE (*)    All other components within normal limits  CBC WITH DIFFERENTIAL/PLATELET - Abnormal; Notable for the following components:   Hemoglobin 11.4 (*)    HCT 35.4 (*)    MCV 78.8 (*)    MCH 25.4 (*)    RDW 17.2 (*)    All other components within normal limits  URINE CULTURE  COMPREHENSIVE METABOLIC PANEL  PREGNANCY, URINE  I-STAT BETA HCG BLOOD, ED ([facility], [facility], [facility] ONLY)    EKG None  Radiology CT Renal Stone Study  Result Date: 01/12/2021 CLINICAL DATA:  Left flank pain, dysuria, hematuria EXAM: CT ABDOMEN AND PELVIS WITHOUT CONTRAST TECHNIQUE: Multidetector CT imaging of the abdomen and pelvis was performed following the standard protocol without IV contrast. COMPARISON:  09/30/2003 FINDINGS: Lower chest: No acute abnormality. Hepatobiliary: No focal liver abnormality is seen. No gallstones, gallbladder wall thickening, or biliary dilatation. Pancreas: Unremarkable Spleen: Unremarkable  Adrenals/Urinary Tract: The adrenal glands are unremarkable. The kidneys are normal on this noncontrast examination. Specifically, the kidneys are normal in size and position, there is no hydronephrosis, and no intrarenal or ureteral calculi are identified. There is marked circumferential bladder wall thickening and extensive perivesicular inflammatory stranding in keeping with underlying diffuse infectious or inflammatory cystitis. The bladder is decompressed. Stomach/Bowel: Stomach is within normal limits. Appendix appears normal. No evidence of bowel wall thickening, distention, or inflammatory changes. No free intraperitoneal gas or fluid. Vascular/Lymphatic: No significant vascular findings are present. No enlarged  abdominal or pelvic lymph nodes. Reproductive: Intrauterine device in expected position with uterus. Pelvic organs are otherwise unremarkable. Other: Tiny fat containing umbilical hernia. Rectum is unremarkable. Musculoskeletal: No acute bone abnormality. No lytic or blastic bone lesions are identified. IMPRESSION: Extensive diffuse bladder wall thickening and perivesicular inflammatory stranding in keeping with a diffuse infectious or inflammatory cystitis. Correlation with urinalysis and urine culture may be helpful. No evidence of bladder outlet obstruction. The kidneys and upper collecting system appear unremarkable bilaterally. Electronically Signed   By: Helyn Numbers M.D.   On: 01/12/2021 19:14    Procedures Procedures   Medications Ordered in ED Medications  cefTRIAXone (ROCEPHIN) 2 g in sodium chloride 0.9 % 100 mL IVPB (2 g Intravenous New Bag/Given 01/12/21 2028)    ED Course  I have reviewed the triage vital signs and the nursing notes.  Pertinent labs & imaging results that were available during my care of the patient were reviewed by me and considered in my medical decision making (see chart for details).    MDM Rules/Calculators/A&P                           Patient  with urinary tract infection.  Patient had urine culture done and is treated with Keflex and Motrin will follow-up with her PCP this week Final Clinical Impression(s) / ED Diagnoses Final diagnoses:  Acute cystitis with hematuria    Rx / DC Orders ED Discharge Orders          Ordered    cephALEXin (KEFLEX) 500 MG capsule        01/12/21 2048    ibuprofen (ADVIL) 800 MG tablet  Every 8 hours PRN        01/12/21 2048             Bethann Berkshire, MD 01/12/21 2051

## 2021-01-14 LAB — URINE CULTURE

## 2022-01-09 ENCOUNTER — Other Ambulatory Visit: Payer: Self-pay | Admitting: Family Medicine

## 2022-01-09 ENCOUNTER — Ambulatory Visit
Admission: RE | Admit: 2022-01-09 | Discharge: 2022-01-09 | Disposition: A | Discharge status: Home / Self Care | Payer: Medicaid Other | Source: Ambulatory Visit | Attending: Family Medicine | Admitting: Family Medicine

## 2022-01-09 DIAGNOSIS — Z1231 Encounter for screening mammogram for malignant neoplasm of breast: Secondary | ICD-10-CM

## 2022-01-13 ENCOUNTER — Other Ambulatory Visit: Payer: Self-pay | Admitting: Family Medicine

## 2022-01-13 DIAGNOSIS — R928 Other abnormal and inconclusive findings on diagnostic imaging of breast: Secondary | ICD-10-CM

## 2022-01-23 ENCOUNTER — Ambulatory Visit
Admission: RE | Admit: 2022-01-23 | Discharge: 2022-01-23 | Disposition: A | Discharge status: Home / Self Care | Payer: Medicaid Other | Source: Ambulatory Visit | Attending: Family Medicine | Admitting: Family Medicine

## 2022-01-23 ENCOUNTER — Ambulatory Visit
Admission: RE | Admit: 2022-01-23 | Discharge: 2022-01-23 | Disposition: A | Discharge status: Home / Self Care | Payer: BLUE CROSS/BLUE SHIELD | Source: Ambulatory Visit | Attending: Family Medicine | Admitting: Family Medicine

## 2022-01-23 ENCOUNTER — Other Ambulatory Visit: Payer: Self-pay | Admitting: Family Medicine

## 2022-01-23 DIAGNOSIS — R928 Other abnormal and inconclusive findings on diagnostic imaging of breast: Secondary | ICD-10-CM

## 2022-01-23 DIAGNOSIS — N6320 Unspecified lump in the left breast, unspecified quadrant: Secondary | ICD-10-CM

## 2022-03-26 ENCOUNTER — Encounter (HOSPITAL_BASED_OUTPATIENT_CLINIC_OR_DEPARTMENT_OTHER): Payer: Self-pay | Admitting: Emergency Medicine

## 2022-03-26 ENCOUNTER — Other Ambulatory Visit: Payer: Self-pay

## 2022-03-26 ENCOUNTER — Emergency Department (HOSPITAL_BASED_OUTPATIENT_CLINIC_OR_DEPARTMENT_OTHER)
Admission: EM | Admit: 2022-03-26 | Discharge: 2022-03-26 | Disposition: A | Discharge status: Home / Self Care | Payer: Medicaid Other | Attending: Emergency Medicine | Admitting: Emergency Medicine

## 2022-03-26 DIAGNOSIS — R197 Diarrhea, unspecified: Secondary | ICD-10-CM | POA: Diagnosis not present

## 2022-03-26 DIAGNOSIS — Z1152 Encounter for screening for COVID-19: Secondary | ICD-10-CM | POA: Diagnosis not present

## 2022-03-26 LAB — CBC
HCT: 35.4 % — ABNORMAL LOW (ref 36.0–46.0)
Hemoglobin: 11.4 g/dL — ABNORMAL LOW (ref 12.0–15.0)
MCH: 24.7 pg — ABNORMAL LOW (ref 26.0–34.0)
MCHC: 32.2 g/dL (ref 30.0–36.0)
MCV: 76.6 fL — ABNORMAL LOW (ref 80.0–100.0)
Platelets: 333 10*3/uL (ref 150–400)
RBC: 4.62 MIL/uL (ref 3.87–5.11)
RDW: 17.6 % — ABNORMAL HIGH (ref 11.5–15.5)
WBC: 6.2 10*3/uL (ref 4.0–10.5)
nRBC: 0 % (ref 0.0–0.2)

## 2022-03-26 LAB — URINALYSIS, ROUTINE W REFLEX MICROSCOPIC
Bilirubin Urine: NEGATIVE
Glucose, UA: NEGATIVE mg/dL
Ketones, ur: NEGATIVE mg/dL
Nitrite: NEGATIVE
Protein, ur: NEGATIVE mg/dL
Specific Gravity, Urine: 1.02 (ref 1.005–1.030)
pH: 5.5 (ref 5.0–8.0)

## 2022-03-26 LAB — COMPREHENSIVE METABOLIC PANEL
ALT: 13 U/L (ref 0–44)
AST: 19 U/L (ref 15–41)
Albumin: 3.4 g/dL — ABNORMAL LOW (ref 3.5–5.0)
Alkaline Phosphatase: 69 U/L (ref 38–126)
Anion gap: 6 (ref 5–15)
BUN: 10 mg/dL (ref 6–20)
CO2: 25 mmol/L (ref 22–32)
Calcium: 8.7 mg/dL — ABNORMAL LOW (ref 8.9–10.3)
Chloride: 108 mmol/L (ref 98–111)
Creatinine, Ser: 1 mg/dL (ref 0.44–1.00)
GFR, Estimated: >60 mL/min (ref >60)
Glucose, Bld: 101 mg/dL — ABNORMAL HIGH (ref 70–99)
Potassium: 3.5 mmol/L (ref 3.5–5.1)
Sodium: 139 mmol/L (ref 135–145)
Total Bilirubin: 0.6 mg/dL (ref 0.3–1.2)
Total Protein: 7.8 g/dL (ref 6.5–8.1)

## 2022-03-26 LAB — URINALYSIS, MICROSCOPIC (REFLEX)

## 2022-03-26 LAB — PREGNANCY, URINE: Preg Test, Ur: NEGATIVE

## 2022-03-26 LAB — RESP PANEL BY RT-PCR (FLU A&B, COVID) ARPGX2
Influenza A by PCR: NEGATIVE
Influenza B by PCR: NEGATIVE
SARS Coronavirus 2 by RT PCR: NEGATIVE

## 2022-03-26 LAB — LIPASE, BLOOD: Lipase: 39 U/L (ref 11–51)

## 2022-03-26 MED ORDER — LOPERAMIDE HCL 2 MG PO CAPS: 2.0000 mg | ORAL_CAPSULE | Freq: Four times a day (QID) | ORAL | 0 refills | Status: DC | PRN

## 2022-03-26 MED ORDER — LOPERAMIDE HCL 2 MG PO CAPS: 2.0000 mg | ORAL_CAPSULE | Freq: Once | ORAL | Status: DC

## 2022-03-26 MED ORDER — LOPERAMIDE HCL 2 MG PO CAPS
4.0000 mg | ORAL_CAPSULE | Freq: Once | ORAL | Status: AC
  Administered 2022-03-26: 4 mg via ORAL
  Filled 2022-03-26: qty 2

## 2022-03-26 NOTE — Discharge Instructions (Signed)
1.  Most diarrhea in healthy people resolves after several days to a week.  It is very often due to a viral infection.  Continue to stay well-hydrated.  Wash your hands well after bowel movements and cleaning. 2.  You may take some Imodium if needed to decrease stool output. 3.  Schedule a recheck with your doctor in 3 to 5 days.  You are still having diarrhea after another 5 or so days, you may need additional evaluation including stool cultures. 4.  Return to the emergency department if you are getting fevers abdominal pain vomiting or other concerning changes.

## 2022-03-26 NOTE — ED Triage Notes (Signed)
Diarrhea x 4 days , denies abd pain , fever , headache . Denies congestion .

## 2022-03-26 NOTE — ED Provider Notes (Signed)
[facility] EMERGENCY DEPARTMENT Provider Note   CSN: [id number] Arrival date & time: 03/26/22  1831     History  Chief Complaint  Patient presents with   Diarrhea    Rachel Burke is a 41 y.o. female.  HPI Patient reports has had diarrhea for about 4 days now.  She reports for about the first 2-1/2 to 3 days she had some low-grade fever and a mild headache.  Those symptoms have now resolved.  She is continued to have about 10 bowel movements a day.  She has not seen any blood.  No abdominal pain.  No vomiting.  Patient does not think she is has sick contact.  She lives home with other family members including her daughter and grandchild.  She denies that any of them are ill.  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  Does not work in a healthcare setting, she has not had any recent antibiotics, she has not been in contact with anybody who has diarrhea that she is aware of.  She cannot think of any food she is eating that she thinks initiated symptoms.  She has city water at her home.  No recent travel.    Home Medications Prior to Admission medications   Medication Sig Start Date End Date Taking? Authorizing Provider  loperamide (IMODIUM) 2 MG capsule Take 1 capsule (2 mg total) by mouth 4 (four) times daily as needed for diarrhea or loose stools. 03/26/22  Yes Charlesetta Shanks, MD  cephALEXin (KEFLEX) 500 MG capsule Take one pill qid 01/12/21   Milton Ferguson, MD  diphenhydrAMINE (BENADRYL) 12.5 MG/5ML liquid Take 5 mLs (12.5 mg total) by mouth 4 (four) times daily as needed. Mix 5 mL with 5 mL of Maalox. Hold in mouth and swallow. Take the 10 mL q 4-6 hr prn pain 10/23/11 11/02/11  Melynda Ripple, MD  doxycycline (VIBRAMYCIN) 100 MG capsule Take 100 mg by mouth 2 (two) times daily. 01/09/21   [provider]  gabapentin (NEURONTIN) 100 MG capsule Take 100 mg by mouth 3 (three) times daily as needed. 04/30/20   [provider]  ibuprofen (ADVIL) 800 MG tablet Take 1 tablet (800 mg total) by mouth every 8 (eight) hours as needed for moderate pain. 01/12/21   Milton Ferguson, MD  oxyCODONE-acetaminophen (PERCOCET/ROXICET) 5-325 MG tablet Take 1 tablet by mouth every 6 (six) hours as needed for moderate pain or severe pain. 08/16/20   [provider]  sucralfate (CARAFATE) 1 GM/10ML suspension Take 10 mLs (1 g total) by mouth 4 (four) times daily. 10 mL before meals and before bedtime 10/23/11 11/22/11  Melynda Ripple, MD  traMADol (ULTRAM) 50 MG tablet Take 50 mg by mouth every 6 (six) hours as needed for moderate pain or severe pain. 12/11/20   [provider]      Allergies    Patient has no known allergies.    Review of Systems   Review of Systems  Physical Exam Updated Vital Signs BP 108/67 (BP Location: Right Wrist)   Pulse 69   Temp 98.4 F (36.9 C) (Oral)   Resp 17   Ht 5\' 5"  (1.651 m)   Wt (!) 159.7 kg   LMP 03/23/2022   SpO2 100%   BMI 58.58 kg/m  Physical Exam Constitutional:      Comments: Alert nontoxic clinically well in appearance.  HENT:     Mouth/Throat:     Pharynx: Oropharynx is clear.  Cardiovascular:     Rate and Rhythm: Normal rate and regular rhythm.  Pulmonary:     Effort: Pulmonary effort is normal.     Breath sounds: Normal breath sounds.  Abdominal:     General: There is no distension.     Palpations: Abdomen is soft.     Tenderness: There is no abdominal tenderness. There is no guarding.  Musculoskeletal:        General: Normal range of motion.     Right lower leg: No edema.     Left lower leg: No edema.  Skin:    General: Skin is warm and dry.  Neurological:     General: No focal deficit present.     Mental Status: She is oriented to person, place, and time.     Coordination: Coordination normal.  Psychiatric:        Mood and Affect: Mood normal.     ED Results / Procedures / Treatments   Labs (all labs ordered are listed, but only  abnormal results are displayed) Labs Reviewed  COMPREHENSIVE METABOLIC PANEL - Abnormal; Notable for the following components:      Result Value   Glucose, Bld 101 (*)    Calcium 8.7 (*)    Albumin 3.4 (*)    All other components within normal limits  CBC - Abnormal; Notable for the following components:   Hemoglobin 11.4 (*)    HCT 35.4 (*)    MCV 76.6 (*)    MCH 24.7 (*)    RDW 17.6 (*)    All other components within normal limits  URINALYSIS, ROUTINE W REFLEX MICROSCOPIC - Abnormal; Notable for the following components:   APPearance HAZY (*)    Hgb urine dipstick LARGE (*)    Leukocytes,Ua SMALL (*)    All other components within normal limits  URINALYSIS, MICROSCOPIC (REFLEX) - Abnormal; Notable for the following components:   Bacteria, UA MANY (*)    All other components within normal limits  RESP PANEL BY RT-PCR (FLU A&B, COVID) ARPGX2  LIPASE, BLOOD  PREGNANCY, URINE    EKG None  Radiology No results found.  Procedures Procedures    Medications Ordered in ED Medications - No data to display  ED Course/ Medical Decision Making/ A&P                           Medical Decision Making Amount and/or Complexity of Data Reviewed Labs: ordered.   Patient presents with approximately 4 days of diarrheal illness.  She is clinically well.  She does not have significant medical history.  Patient is low risk for C. difficile.  She has not had any recent antibiotics and no likely exposures.  Patient has not had any recent new medications and no medications that appear likely to cause diarrhea.  She intermittently takes meloxicam.  Lab work is otherwise within normal limits.  No leukocytosis and no anemia.  Vital signs are stable without signs of hypovolemia.  No electrolyte derangement.  At this time, with patient otherwise well and most likely infectious viral diarrhea, I have recommended several more days of staying hydrated and anticipate likely resolution.  Patient is  advised she can try Imodium if needed for excessive stool.  She is counseled to schedule follow-up with PCP in 4 to 5 days.  In the event symptoms or not significantly improving may need further diagnostic evaluation with stool studies.        Final  Clinical Impression(s) / ED Diagnoses Final diagnoses:  Diarrhea of presumed infectious origin    Rx / DC Orders ED Discharge Orders          Ordered    loperamide (IMODIUM) 2 MG capsule  4 times daily PRN        03/26/22 2232              Arby Barrette, MD 03/26/22 2239

## 2022-03-26 NOTE — ED Notes (Signed)
D/c paperwork reviewed with pt, including prescription and f/u care. Pt verbalized understanding, no questions or concerns at time of d/c. Pt ambulatory to ED exit without assistance, NAD.

## 2022-03-26 NOTE — ED Notes (Signed)
Pt to RN station prior to d/c asking for single dose of imodium due to preferred pharmacy being closed at this time. EDP Pfeiffer made aware.

## 2022-07-28 ENCOUNTER — Ambulatory Visit
Admission: RE | Admit: 2022-07-28 | Discharge: 2022-07-28 | Disposition: A | Discharge status: Home / Self Care | Payer: Medicaid Other | Source: Ambulatory Visit | Attending: Family Medicine | Admitting: Family Medicine

## 2022-07-28 ENCOUNTER — Ambulatory Visit
Admission: RE | Admit: 2022-07-28 | Discharge: 2022-07-28 | Disposition: A | Discharge status: Home / Self Care | Payer: BLUE CROSS/BLUE SHIELD | Source: Ambulatory Visit | Attending: Family Medicine | Admitting: Family Medicine

## 2022-07-28 DIAGNOSIS — N6320 Unspecified lump in the left breast, unspecified quadrant: Secondary | ICD-10-CM

## 2022-07-29 ENCOUNTER — Other Ambulatory Visit: Payer: Self-pay | Admitting: Family Medicine

## 2022-07-29 DIAGNOSIS — N6019 Diffuse cystic mastopathy of unspecified breast: Secondary | ICD-10-CM

## 2023-01-14 ENCOUNTER — Other Ambulatory Visit: Payer: Medicaid Other

## 2023-02-04 ENCOUNTER — Ambulatory Visit
Admission: RE | Admit: 2023-02-04 | Discharge: 2023-02-04 | Disposition: A | Discharge status: Home / Self Care | Payer: Medicaid Other | Source: Ambulatory Visit | Attending: Family Medicine | Admitting: Family Medicine

## 2023-02-04 DIAGNOSIS — N6019 Diffuse cystic mastopathy of unspecified breast: Secondary | ICD-10-CM

## 2023-04-09 ENCOUNTER — Other Ambulatory Visit: Payer: Self-pay

## 2023-04-09 ENCOUNTER — Emergency Department (HOSPITAL_BASED_OUTPATIENT_CLINIC_OR_DEPARTMENT_OTHER)
Admission: EM | Admit: 2023-04-09 | Discharge: 2023-04-09 | Disposition: A | Discharge status: Home / Self Care | Payer: Medicaid Other | Attending: Emergency Medicine | Admitting: Emergency Medicine

## 2023-04-09 ENCOUNTER — Other Ambulatory Visit (HOSPITAL_BASED_OUTPATIENT_CLINIC_OR_DEPARTMENT_OTHER): Payer: Self-pay

## 2023-04-09 ENCOUNTER — Encounter (HOSPITAL_BASED_OUTPATIENT_CLINIC_OR_DEPARTMENT_OTHER): Payer: Self-pay | Admitting: Urology

## 2023-04-09 DIAGNOSIS — Y92410 Unspecified street and highway as the place of occurrence of the external cause: Secondary | ICD-10-CM | POA: Insufficient documentation

## 2023-04-09 DIAGNOSIS — M5442 Lumbago with sciatica, left side: Secondary | ICD-10-CM | POA: Diagnosis present

## 2023-04-09 DIAGNOSIS — N39498 Other specified urinary incontinence: Secondary | ICD-10-CM

## 2023-04-09 DIAGNOSIS — N390 Urinary tract infection, site not specified: Secondary | ICD-10-CM

## 2023-04-09 DIAGNOSIS — Z87828 Personal history of other (healed) physical injury and trauma: Secondary | ICD-10-CM

## 2023-04-09 DIAGNOSIS — R32 Unspecified urinary incontinence: Secondary | ICD-10-CM | POA: Insufficient documentation

## 2023-04-09 DIAGNOSIS — M62830 Muscle spasm of back: Secondary | ICD-10-CM | POA: Diagnosis not present

## 2023-04-09 LAB — URINALYSIS, ROUTINE W REFLEX MICROSCOPIC
Bilirubin Urine: NEGATIVE
Glucose, UA: NEGATIVE mg/dL
Hgb urine dipstick: NEGATIVE
Ketones, ur: NEGATIVE mg/dL
Leukocytes,Ua: NEGATIVE
Nitrite: POSITIVE — AB
Protein, ur: NEGATIVE mg/dL
Specific Gravity, Urine: 1.02 (ref 1.005–1.030)
pH: 7 (ref 5.0–8.0)

## 2023-04-09 LAB — PREGNANCY, URINE: Preg Test, Ur: NEGATIVE

## 2023-04-09 LAB — URINALYSIS, MICROSCOPIC (REFLEX)

## 2023-04-09 MED ORDER — PREDNISONE 50 MG PO TABS
50.0000 mg | ORAL_TABLET | Freq: Every day | ORAL | 0 refills | Status: DC
  Filled 2023-04-09: qty 5, 5d supply, fill #0

## 2023-04-09 MED ORDER — CYCLOBENZAPRINE HCL 10 MG PO TABS
10.0000 mg | ORAL_TABLET | Freq: Once | ORAL | Status: AC
  Administered 2023-04-09: 10 mg via ORAL
  Filled 2023-04-09: qty 1

## 2023-04-09 MED ORDER — LIDOCAINE 5 % EX PTCH
1.0000 | MEDICATED_PATCH | CUTANEOUS | 0 refills | Status: DC
  Filled 2023-04-09: qty 30, 30d supply, fill #0

## 2023-04-09 MED ORDER — KETOROLAC TROMETHAMINE 60 MG/2ML IM SOLN
60.0000 mg | Freq: Once | INTRAMUSCULAR | Status: AC
  Administered 2023-04-09: 60 mg via INTRAMUSCULAR
  Filled 2023-04-09: qty 2

## 2023-04-09 MED ORDER — KETOROLAC TROMETHAMINE 30 MG/ML IJ SOLN
30.0000 mg | Freq: Once | INTRAMUSCULAR | Status: DC
  Filled 2023-04-09: qty 1

## 2023-04-09 MED ORDER — LIDOCAINE 5 % EX PTCH
1.0000 | MEDICATED_PATCH | CUTANEOUS | Status: DC
  Administered 2023-04-09: 1 via TRANSDERMAL
  Filled 2023-04-09: qty 1

## 2023-04-09 MED ORDER — CEPHALEXIN 500 MG PO CAPS
500.0000 mg | ORAL_CAPSULE | Freq: Three times a day (TID) | ORAL | 0 refills | Status: AC
  Filled 2023-04-09: qty 21, 7d supply, fill #0

## 2023-04-09 MED ORDER — CYCLOBENZAPRINE HCL 10 MG PO TABS
10.0000 mg | ORAL_TABLET | Freq: Two times a day (BID) | ORAL | 0 refills | Status: DC | PRN
  Filled 2023-04-09: qty 20, 10d supply, fill #0

## 2023-04-09 MED ORDER — PREDNISONE 50 MG PO TABS: 50.0000 mg | ORAL_TABLET | Freq: Every day | ORAL | 0 refills | Status: DC

## 2023-04-09 NOTE — ED Triage Notes (Signed)
Pt states left hip and back pain that started yesterday at 1500.  Denies any injury  Pain worse with movement and bending   H/o back spasms
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
## 2023-04-12 LAB — URINE CULTURE: Culture: 60000 — AB

## 2023-04-13 ENCOUNTER — Telehealth (HOSPITAL_BASED_OUTPATIENT_CLINIC_OR_DEPARTMENT_OTHER): Payer: Self-pay | Admitting: *Deleted

## 2023-04-13 NOTE — Telephone Encounter (Signed)
Post ED Visit - Positive Culture Follow-up: Successful Patient Follow-Up  Culture assessed and recommendations reviewed by:  []  Enzo Bi, Pharm.D. []  Celedonio Miyamoto, Pharm.D., BCPS AQ-ID []  Garvin Fila, Pharm.D., BCPS []  Georgina Pillion, Pharm.D., BCPS []  [place], [address].D., BCPS, AAHIVP []  Estella Husk, Pharm.D., BCPS, AAHIVP []  Lysle Pearl, PharmD, BCPS []  Phillips Climes, PharmD, BCPS []  Agapito Games, PharmD, BCPS [x] Lennie Muckle, PharmD  Positive urine culture  []  Patient discharged without antimicrobial prescription and treatment is now indicated [x]  Organism is resistant to prescribed ED discharge antimicrobial []  Patient with positive blood cultures  Changes discussed with ED provider: Estelle June, DO Plan: Stop Cephalexin. Ok not to treat   Contacted patient, date 04/13/23, time 0900   Patsey Berthold 04/13/2023, 9:00 AM

## 2023-04-13 NOTE — Progress Notes (Signed)
ED Antimicrobial Stewardship Positive Culture Follow Up   Rachel Burke is an 42 y.o. female who presented to [facility] on 04/09/2023 with a chief complaint of  Chief Complaint  Patient presents with   Back Pain    Recent Results (from the past 720 hour(s))  Urine Culture     Status: Abnormal   Collection Time: 04/09/23 11:26 AM   Specimen: Urine, Clean Catch  Result Value Ref Range Status   Specimen Description   Final    URINE, CLEAN CATCH Performed at [facility], [address] [place], [place] [address]    Special Requests   Final    NONE Performed at [facility], [address] [place], [place] [address]    Culture (A)  Final    60,000 COLONIES/mL STAPHYLOCOCCUS SAPROPHYTICUS >=100,000 COLONIES/mL AEROCOCCUS SPECIES Standardized susceptibility testing for this organism is not available. Performed at [facility], [address] [place], [place] [address]    Report Status 04/12/2023 FINAL  Final   Organism ID, Bacteria STAPHYLOCOCCUS SAPROPHYTICUS (A)  Final      Susceptibility   Staphylococcus saprophyticus - MIC*    CIPROFLOXACIN <=0.5 SENSITIVE Sensitive     GENTAMICIN <=0.5 SENSITIVE Sensitive     NITROFURANTOIN <=16 SENSITIVE Sensitive     OXACILLIN 1 RESISTANT Resistant     TETRACYCLINE <=1 SENSITIVE Sensitive     VANCOMYCIN <=0.5 SENSITIVE Sensitive     TRIMETH/SULFA <=10 SENSITIVE Sensitive     CLINDAMYCIN <=0.25 SENSITIVE Sensitive     RIFAMPIN <=0.5 SENSITIVE Sensitive     Inducible Clindamycin NEGATIVE Sensitive     * 60,000 COLONIES/mL STAPHYLOCOCCUS SAPROPHYTICUS    [x]  Treated with cephalexin, organism resistant to prescribed antimicrobial  New antibiotic prescription: STOP cephalexin, do not treat  ED Provider: Estelle June, DO  Lennie Muckle, PharmD PGY1 Pharmacy Resident 04/13/2023 8:39 AM

## 2023-05-08 ENCOUNTER — Other Ambulatory Visit: Payer: Self-pay

## 2023-05-08 ENCOUNTER — Ambulatory Visit
Admission: EM | Admit: 2023-05-08 | Discharge: 2023-05-08 | Disposition: A | Discharge status: Home / Self Care | Payer: Medicaid Other | Attending: Internal Medicine | Admitting: Internal Medicine

## 2023-05-08 ENCOUNTER — Encounter: Payer: Self-pay | Admitting: Emergency Medicine

## 2023-05-08 DIAGNOSIS — R051 Acute cough: Secondary | ICD-10-CM

## 2023-05-08 DIAGNOSIS — R0982 Postnasal drip: Secondary | ICD-10-CM

## 2023-05-08 MED ORDER — CETIRIZINE HCL 10 MG PO TABS: 10.0000 mg | ORAL_TABLET | Freq: Every day | ORAL | 0 refills | Status: AC

## 2023-05-08 MED ORDER — PROMETHAZINE-DM 6.25-15 MG/5ML PO SYRP: 5.0000 mL | ORAL_SOLUTION | Freq: Every evening | ORAL | 0 refills | Status: AC | PRN

## 2023-05-08 MED ORDER — FLUTICASONE PROPIONATE 50 MCG/ACT NA SUSP: 1.0000 | Freq: Every day | NASAL | 0 refills | Status: AC

## 2023-05-08 NOTE — ED Provider Notes (Signed)
[facility]    CSN: [id number] Arrival date & time: 05/08/23  1642      History   Chief Complaint Chief Complaint  Patient presents with   Cough    HPI Almyra Koral is a 42 y.o. female presents for cough.  Patient reports 1 week of a cough that occurs primarily at night after she lays down.  States throughout the day she does not cough and feels fine.  Denies any congestion, runny nose, sore throat.  No fevers or chills.  No asthma or smoking history.  No history of sleep apnea.  Does endorse seasonal allergies and only takes allergy medicine in the springtime.  No other concerns at this time with   Cough   Past Medical History:  Diagnosis Date   Obesity     There are no active problems to display for this patient.   Past Surgical History:  Procedure Laterality Date   CESAREAN SECTION      OB History   No obstetric history on file.      Home Medications    Prior to Admission medications   Medication Sig Start Date End Date Taking? Authorizing Provider  cetirizine (ZYRTEC) 10 MG tablet Take 1 tablet (10 mg total) by mouth daily. 05/08/23  Yes Radford Pax, NP  fluticasone (FLONASE) 50 MCG/ACT nasal spray Place 1 spray into both nostrils daily. 05/08/23  Yes Radford Pax, NP  promethazine-dextromethorphan (PROMETHAZINE-DM) 6.25-15 MG/5ML syrup Take 5 mLs by mouth at bedtime as needed for cough. 05/08/23  Yes Radford Pax, NP  Semaglutide-Weight Management (WEGOVY) 0.25 MG/0.5ML SOAJ Inject 0.25 mg into the skin once a week.   Yes [provider]  diphenhydrAMINE (BENADRYL) 12.5 MG/5ML liquid Take 5 mLs (12.5 mg total) by mouth 4 (four) times daily as needed. Mix 5 mL with 5 mL of Maalox. Hold in mouth and swallow. Take the 10 mL q 4-6 hr prn pain 10/23/11 11/02/11  Domenick Gong, MD  gabapentin (NEURONTIN) 100 MG capsule Take 100 mg by mouth 3 (three) times daily as needed. 04/30/20   [provider]  sucralfate (CARAFATE) 1 GM/10ML  suspension Take 10 mLs (1 g total) by mouth 4 (four) times daily. 10 mL before meals and before bedtime 10/23/11 11/22/11  Domenick Gong, MD    Family History Family History  Problem Relation Age of Onset   Breast cancer Mother    Cancer Mother    Hypertension Mother    Thyroid disease Mother    Asthma Father    Breast cancer Maternal Grandmother     Social History Social History   Tobacco Use   Smoking status: Never   Smokeless tobacco: Never  Substance Use Topics   Alcohol use: No   Drug use: No     Allergies   Patient has no known allergies.   Review of Systems Review of Systems  Respiratory:  Positive for cough.      Physical Exam Triage Vital Signs ED Triage Vitals  Encounter Vitals Group     BP 05/08/23 1703 118/76     Systolic BP Percentile --      Diastolic BP Percentile --      Pulse Rate 05/08/23 1703 80     Resp 05/08/23 1703 18     Temp 05/08/23 1703 97.7 F (36.5 C)     Temp Source 05/08/23 1703 Oral     SpO2 05/08/23 1703 98 %     Weight --  Height --      Head Circumference --      Peak Flow --      Pain Score 05/08/23 1658 0     Pain Loc --      Pain Education --      Exclude from Growth Chart --    No data found.  Updated Vital Signs BP 118/76 (BP Location: Right Wrist)   Pulse 80   Temp 97.7 F (36.5 C) (Oral)   Resp 18   LMP 05/05/2023   SpO2 98%   Visual Acuity Right Eye Distance:   Left Eye Distance:   Bilateral Distance:    Right Eye Near:   Left Eye Near:    Bilateral Near:     Physical Exam Vitals and nursing note reviewed.  Constitutional:      General: She is not in acute distress.    Appearance: She is well-developed. She is not ill-appearing.  HENT:     Head: Normocephalic and atraumatic.     Right Ear: Tympanic membrane and ear canal normal.     Left Ear: Tympanic membrane and ear canal normal.     Nose: Rhinorrhea present. No congestion.     Mouth/Throat:     Mouth: Mucous membranes are moist.      Pharynx: Oropharynx is clear. Uvula midline. Postnasal drip present. No oropharyngeal exudate or posterior oropharyngeal erythema.     Tonsils: No tonsillar exudate or tonsillar abscesses.  Eyes:     Conjunctiva/sclera: Conjunctivae normal.     Pupils: Pupils are equal, round, and reactive to light.  Cardiovascular:     Rate and Rhythm: Normal rate and regular rhythm.     Heart sounds: Normal heart sounds.  Pulmonary:     Effort: Pulmonary effort is normal.     Breath sounds: Normal breath sounds.  Musculoskeletal:     Cervical back: Normal range of motion and neck supple.  Lymphadenopathy:     Cervical: No cervical adenopathy.  Skin:    General: Skin is warm and dry.  Neurological:     General: No focal deficit present.     Mental Status: She is alert and oriented to person, place, and time.  Psychiatric:        Mood and Affect: Mood normal.        Behavior: Behavior normal.      UC Treatments / Results  Labs (all labs ordered are listed, but only abnormal results are displayed) Labs Reviewed - No data to display  EKG   Radiology No results found.  Procedures Procedures (including critical care time)  Medications Ordered in UC Medications - No data to display  Initial Impression / Assessment and Plan / UC Course  I have reviewed the triage vital signs and the nursing notes.  Pertinent labs & imaging results that were available during my care of the patient were reviewed by me and considered in my medical decision making (see chart for details).     Reviewed exam and symptoms with patient.  Discussed likely postnasal drip induced cough.  Will do trial of Flonase and cetirizine.  Can take Promethazine DM at night as needed for cough.  Advised PCP follow-up if symptoms do not improve.  ER precautions reviewed. Final Clinical Impressions(s) / UC Diagnoses   Final diagnoses:  Post-nasal drip  Acute cough     Discharge Instructions      Start Flonase  daily.  Promethazine DM as needed at night for cough.  Also  start cetirizine daily for the next 2 weeks.  Please follow-up with your PCP if your symptoms do not improve.  Please go to the ER for any worsening symptoms.  Hope you feel better soon!    ED Prescriptions     Medication Sig Dispense Auth. Provider   fluticasone (FLONASE) 50 MCG/ACT nasal spray Place 1 spray into both nostrils daily. 15.8 mL Radford Pax, NP   cetirizine (ZYRTEC) 10 MG tablet Take 1 tablet (10 mg total) by mouth daily. 30 tablet Radford Pax, NP   promethazine-dextromethorphan (PROMETHAZINE-DM) 6.25-15 MG/5ML syrup Take 5 mLs by mouth at bedtime as needed for cough. 118 mL Radford Pax, NP      PDMP not reviewed this encounter.   Radford Pax, NP 05/08/23 [id number]

## 2023-05-08 NOTE — ED Triage Notes (Addendum)
Patient presents to [facility] for evaluation of a cough at nighttime for 1 week.  Says she is fine through out the day but cough worsens at night.  Says it has been waking her up.  She says her grandbaby has been having a cough for a while.  Denies any other exposure.

## 2023-05-08 NOTE — Discharge Instructions (Addendum)
Start Flonase daily.  Promethazine DM as needed at night for cough.  Also start cetirizine daily for the next 2 weeks.  Please follow-up with your PCP if your symptoms do not improve.  Please go to the ER for any worsening symptoms.  Hope you feel better soon!

## 2023-08-07 IMAGING — CT CT RENAL STONE PROTOCOL
2 of 4 series · 16 of 46 positions shown, 18 images · non-contrast
Comparison: 09/30/2003

CLINICAL DATA: Left flank pain, dysuria, hematuria

EXAM:
CT ABDOMEN AND PELVIS WITHOUT CONTRAST
TECHNIQUE: Multidetector CT imaging of the abdomen and pelvis was performed
following the standard protocol without IV contrast.

[Series 2: axial st · axial · 0.74mm/px · z∈[-656,-256]mm · 13 of 92 slices shown, 15 images]
[im 6/92  soft-tissue]
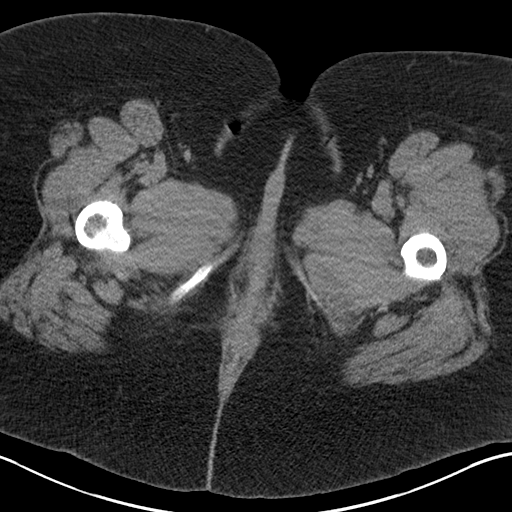
[im 6/92  bone]
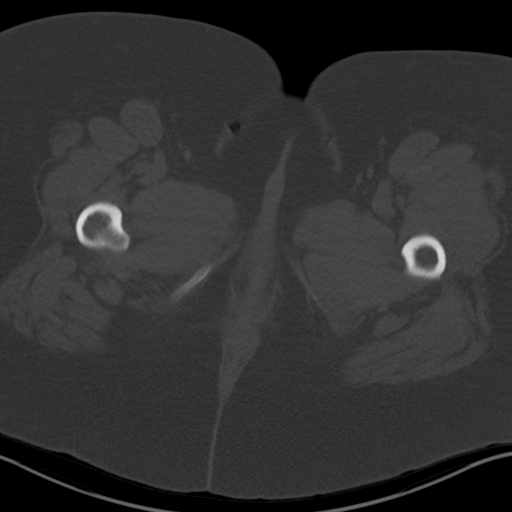
[im 11/92  soft-tissue]
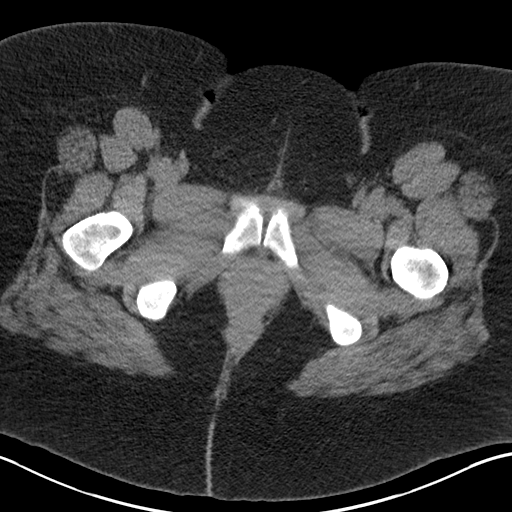
[im 22/92  soft-tissue]
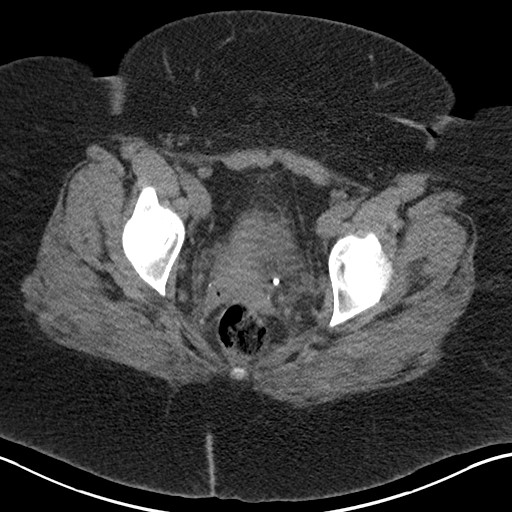
[im 27/92  soft-tissue]
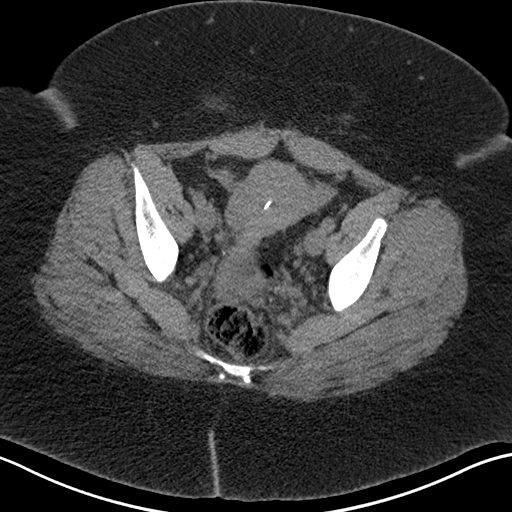
[im 33/92  soft-tissue]
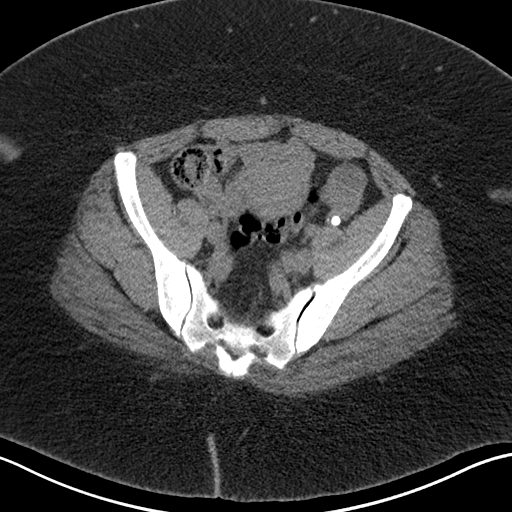
[im 38/92  soft-tissue]
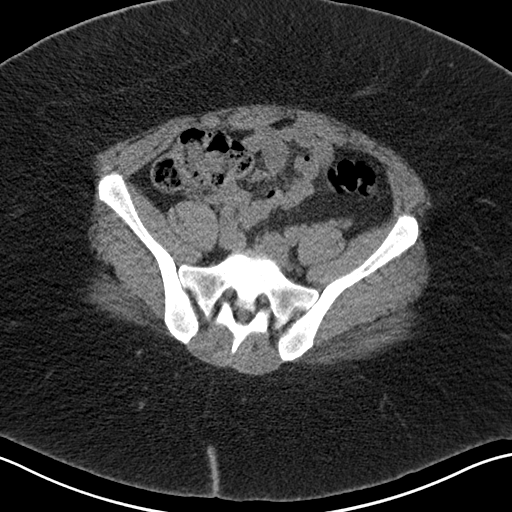
[im 49/92  soft-tissue]
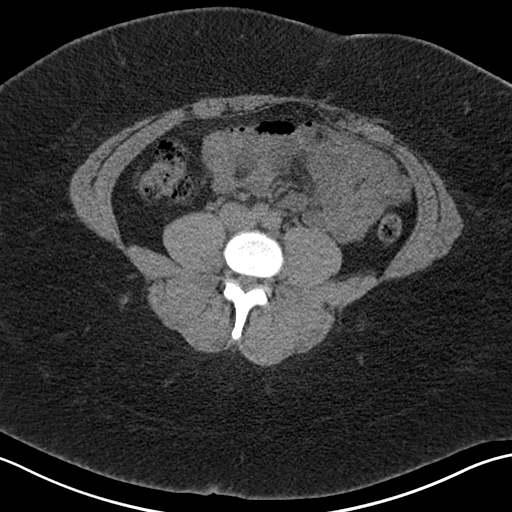
[im 54/92  soft-tissue]
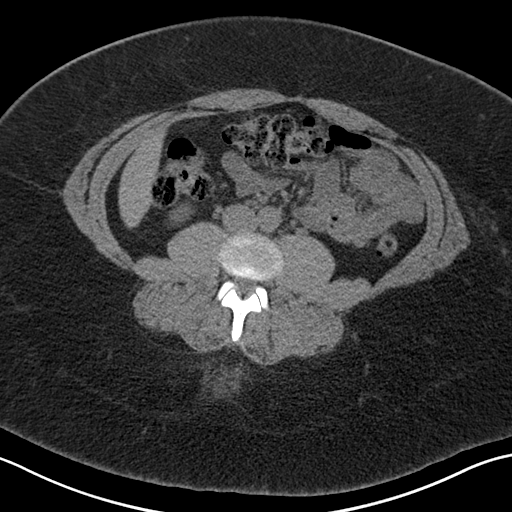
[im 59/92  soft-tissue]
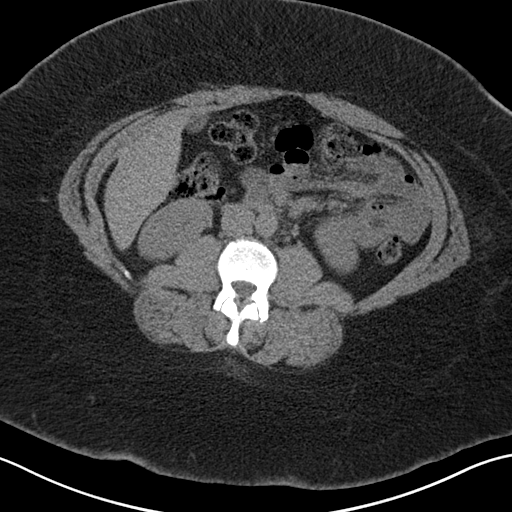
[im 59/92  bone]
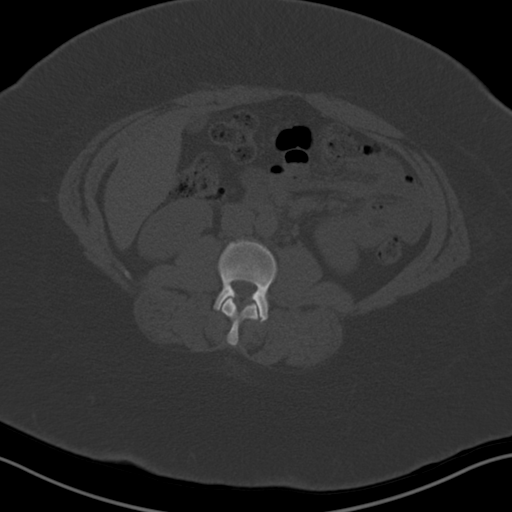
[im 65/92  soft-tissue]
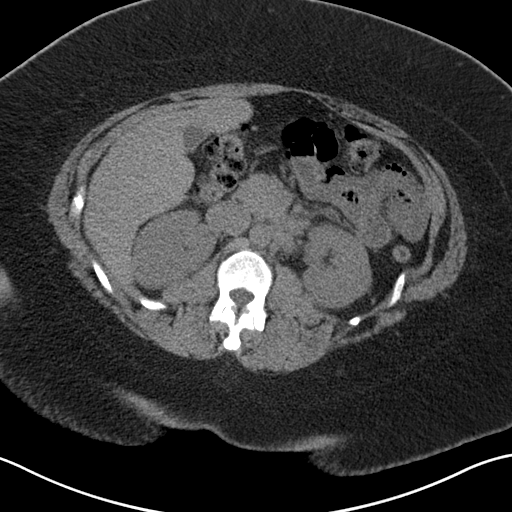
[im 70/92  soft-tissue]
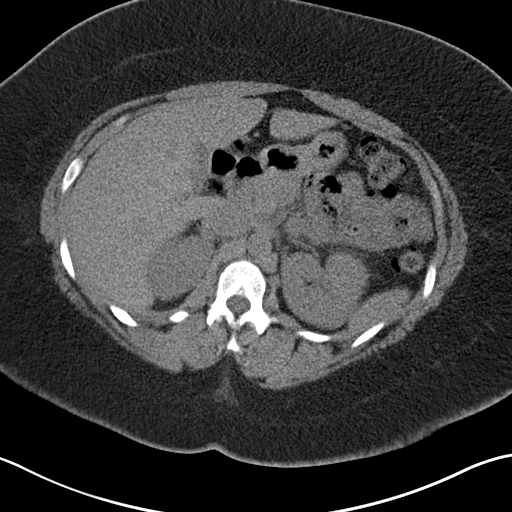
[im 81/92  soft-tissue]
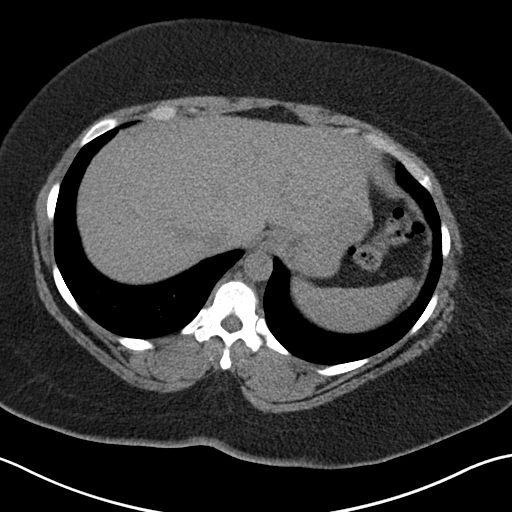
[im 86/92  soft-tissue]
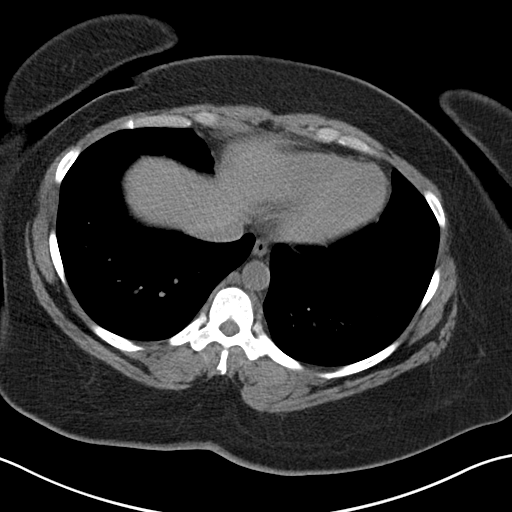

[Series 5: coronal · coronal · 0.77mm/px · 3 of 157 slices shown]
[im 53/157  soft-tissue]
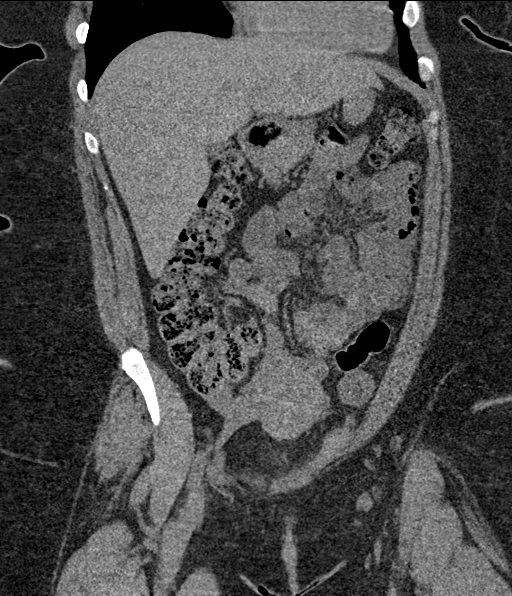
[im 70/157  soft-tissue]
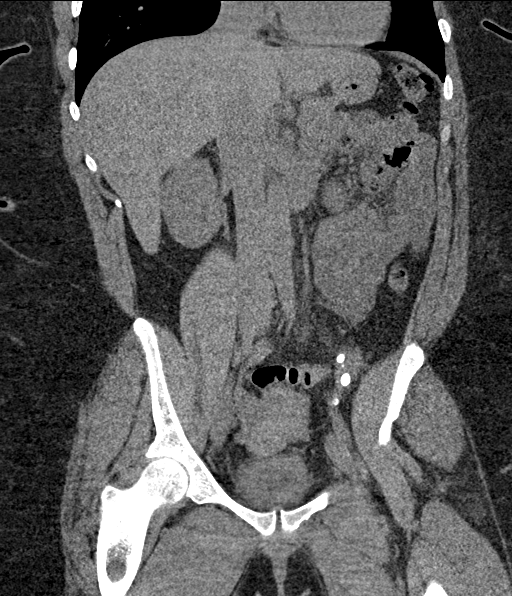
[im 87/157  soft-tissue]
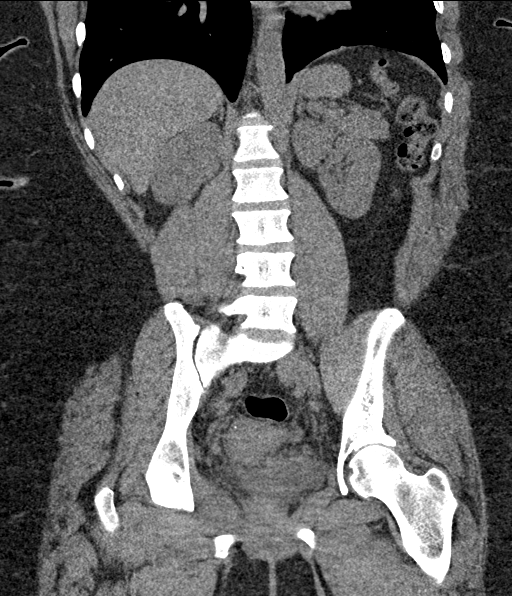

[16 of 46 positions shown; findings below may reference images not displayed]

FINDINGS: Lower chest: No acute abnormality.

Hepatobiliary: No focal liver abnormality is seen. No gallstones,
gallbladder wall thickening, or biliary dilatation.

Pancreas: Unremarkable

Spleen: Unremarkable

Adrenals/Urinary Tract: The adrenal glands are unremarkable. The
kidneys are normal on this noncontrast examination. Specifically,
the kidneys are normal in size and position, there is no
hydronephrosis, and no intrarenal or ureteral calculi are
identified. There is marked circumferential bladder wall thickening
and extensive perivesicular inflammatory stranding in keeping with
underlying diffuse infectious or inflammatory cystitis. The bladder
is decompressed.

Stomach/Bowel: Stomach is within normal limits. Appendix appears
normal. No evidence of bowel wall thickening, distention, or
inflammatory changes. No free intraperitoneal gas or fluid.

Vascular/Lymphatic: No significant vascular findings are present. No
enlarged abdominal or pelvic lymph nodes.

Reproductive: Intrauterine device in expected position with uterus.
Pelvic organs are otherwise unremarkable.

Other: Tiny fat containing umbilical hernia. Rectum is unremarkable.

Musculoskeletal: No acute bone abnormality. No lytic or blastic bone
lesions are identified.
IMPRESSION: Extensive diffuse bladder wall thickening and perivesicular
inflammatory stranding in keeping with a diffuse infectious or
inflammatory cystitis. Correlation with urinalysis and urine culture
may be helpful. No evidence of bladder outlet obstruction. The
kidneys and upper collecting system appear unremarkable bilaterally.

## 2023-11-16 ENCOUNTER — Ambulatory Visit (HOSPITAL_COMMUNITY)
Admission: EM | Admit: 2023-11-16 | Discharge: 2023-11-16 | Disposition: A | Discharge status: Home / Self Care | Attending: Nurse Practitioner | Admitting: Nurse Practitioner

## 2023-11-16 ENCOUNTER — Other Ambulatory Visit: Payer: Self-pay

## 2023-11-16 ENCOUNTER — Encounter (HOSPITAL_COMMUNITY): Payer: Self-pay | Admitting: Emergency Medicine

## 2023-11-16 DIAGNOSIS — R109 Unspecified abdominal pain: Secondary | ICD-10-CM | POA: Diagnosis not present

## 2023-11-16 DIAGNOSIS — R197 Diarrhea, unspecified: Secondary | ICD-10-CM | POA: Diagnosis not present

## 2023-11-16 DIAGNOSIS — R112 Nausea with vomiting, unspecified: Secondary | ICD-10-CM

## 2023-11-16 MED ORDER — ONDANSETRON 4 MG PO TBDP: 4.0000 mg | ORAL_TABLET | Freq: Three times a day (TID) | ORAL | 0 refills | Status: AC | PRN

## 2023-11-16 NOTE — ED Notes (Signed)
Provided warm blankets.

## 2023-11-16 NOTE — ED Triage Notes (Signed)
 Diarrhea for 3 days.  Has had 3-4 episodes today.  Patient has not had any medications.   One episode of vomiting was yesterday.  Otherwise, patient is holding down liquids

## 2023-11-16 NOTE — ED Provider Notes (Signed)
 [facility]    CSN: [id number] Arrival date & time: 11/16/23  0913      History   Chief Complaint Chief Complaint  Patient presents with   Diarrhea    HPI Rachel Burke is a 43 y.o. female.   Patient presents today with 3-day history of diarrhea.  She reports that the stool is pure water and has seen drops of blood in the toilet.  She has not seen blood in the stool or a large amount of blood in the toilet.  She also endorses abdominal cramping that lasts a couple of minutes and improves with diarrhea.  She does have some indigestion that she describes as egg burps.  She vomited 1 time yesterday and has felt a little bit nauseous today.  She reports 4 episodes of diarrhea today.  Has not taken any medicine for symptoms so far.  No recent foreign travel, recent ingestion of suspicious drinking water, recent uncooked food, known sick contacts, or recent antibiotic use.  Patient has been able to eat handfuls of crackers at times and keep water down without throwing up.    Past Medical History:  Diagnosis Date   Obesity     There are no active problems to display for this patient.   Past Surgical History:  Procedure Laterality Date   ANKLE SURGERY     CESAREAN SECTION     WRIST SURGERY      OB History   No obstetric history on file.      Home Medications    Prior to Admission medications   Medication Sig Start Date End Date Taking? Authorizing Provider  ondansetron (ZOFRAN-ODT) 4 MG disintegrating tablet Take 1 tablet (4 mg total) by mouth every 8 (eight) hours as needed for nausea or vomiting. 11/16/23  Yes Chandra Raisin A, NP  cetirizine  (ZYRTEC ) 10 MG tablet Take 1 tablet (10 mg total) by mouth daily. 05/08/23   Mayer, Jodi R, NP  diphenhydrAMINE  (BENADRYL ) 12.5 MG/5ML liquid Take 5 mLs (12.5 mg total) by mouth 4 (four) times daily as needed. Mix 5 mL with 5 mL of Maalox. Hold in mouth and swallow. Take the 10 mL q 4-6 hr prn pain 10/23/11 11/02/11   Van Knee, MD  fluticasone  (FLONASE ) 50 MCG/ACT nasal spray Place 1 spray into both nostrils daily. 05/08/23   Mayer, Jodi R, NP  gabapentin (NEURONTIN) 100 MG capsule Take 100 mg by mouth 3 (three) times daily as needed. 04/30/20   [provider]  promethazine -dextromethorphan (PROMETHAZINE -DM) 6.25-15 MG/5ML syrup Take 5 mLs by mouth at bedtime as needed for cough. 05/08/23   Mayer, Jodi R, NP  Semaglutide-Weight Management (WEGOVY) 0.25 MG/0.5ML SOAJ Inject 0.25 mg into the skin once a week.    [provider]  sucralfate  (CARAFATE ) 1 GM/10ML suspension Take 10 mLs (1 g total) by mouth 4 (four) times daily. 10 mL before meals and before bedtime 10/23/11 11/22/11  Van Knee, MD    Family History Family History  Problem Relation Age of Onset   Breast cancer Mother    Cancer Mother    Hypertension Mother    Thyroid disease Mother    Asthma Father    Breast cancer Maternal Grandmother     Social History Social History   Tobacco Use   Smoking status: Never   Smokeless tobacco: Never  Substance Use Topics   Alcohol use: No   Drug use: No     Allergies   Patient has no known allergies.  Review of Systems Review of Systems Per HPI  Physical Exam Triage Vital Signs ED Triage Vitals  Encounter Vitals Group     BP 11/16/23 0942 127/73     Girls Systolic BP Percentile --      Girls Diastolic BP Percentile --      Boys Systolic BP Percentile --      Boys Diastolic BP Percentile --      Pulse Rate 11/16/23 0942 78     Resp 11/16/23 0942 20     Temp 11/16/23 0942 98 F (36.7 C)     Temp Source 11/16/23 0942 Oral     SpO2 11/16/23 0942 96 %     Weight --      Height --      Head Circumference --      Peak Flow --      Pain Score 11/16/23 0936 8     Pain Loc --      Pain Education --      Exclude from Growth Chart --    No data found.  Updated Vital Signs BP 127/73 (BP Location: Right Arm)   Pulse 78   Temp 98 F (36.7 C) (Oral)    Resp 20   LMP 11/08/2023 (Approximate)   SpO2 96%   Visual Acuity Right Eye Distance:   Left Eye Distance:   Bilateral Distance:    Right Eye Near:   Left Eye Near:    Bilateral Near:     Physical Exam Vitals and nursing note reviewed.  Constitutional:      General: She is not in acute distress.    Appearance: Normal appearance. She is not toxic-appearing.  HENT:     Right Ear: Tympanic membrane, ear canal and external ear normal. There is no impacted cerumen.     Left Ear: Tympanic membrane, ear canal and external ear normal. There is no impacted cerumen.     Mouth/Throat:     Mouth: Mucous membranes are moist.     Pharynx: Oropharynx is clear. No posterior oropharyngeal erythema.   Cardiovascular:     Rate and Rhythm: Normal rate and regular rhythm.  Pulmonary:     Effort: Pulmonary effort is normal. No respiratory distress.     Breath sounds: Normal breath sounds. No wheezing, rhonchi or rales.  Abdominal:     General: Abdomen is flat. Bowel sounds are increased. There is no distension.     Palpations: Abdomen is soft.     Tenderness: There is no abdominal tenderness. There is no right CVA tenderness, left CVA tenderness or guarding.   Musculoskeletal:     Cervical back: Normal range of motion.  Lymphadenopathy:     Cervical: No cervical adenopathy.   Skin:    General: Skin is warm and dry.     Capillary Refill: Capillary refill takes less than 2 seconds.     Coloration: Skin is not jaundiced or pale.     Findings: No erythema.   Neurological:     Mental Status: She is alert and oriented to person, place, and time.   Psychiatric:        Behavior: Behavior is cooperative.      UC Treatments / Results  Labs (all labs ordered are listed, but only abnormal results are displayed) Labs Reviewed - No data to display  EKG   Radiology No results found.  Procedures Procedures (including critical care time)  Medications Ordered in UC Medications - No  data to display  Initial Impression / Assessment and Plan / UC Course  I have reviewed the triage vital signs and the nursing notes.  Pertinent labs & imaging results that were available during my care of the patient were reviewed by me and considered in my medical decision making (see chart for details).   Patient is well-appearing, normotensive, afebrile, not tachycardic, not tachypneic, oxygenating well on room air.   1. Abdominal cramping 2. Diarrhea, unspecified type 3. Nausea and vomiting, unspecified vomiting type Vitals and exam are reassuring today Suspect viral gastroenteritis Treat with Zofran 4 mg every 8 hours for nausea/vomiting, brat diet, hydration Return and ER precautions discussed with patient  The patient was given the opportunity to ask questions.  All questions answered to their satisfaction.  The patient is in agreement to this plan.   Final Clinical Impressions(s) / UC Diagnoses   Final diagnoses:  Abdominal cramping  Diarrhea, unspecified type  Nausea and vomiting, unspecified vomiting type     Discharge Instructions      As discussed, your symptoms are consistent with a viral stomach bug.  This should improve over the next 24 to 48 hours.  Continue hydrating with water and sugar-free electrolyte solutions.  You can take the Zofran every 8 hours as needed for nausea/vomiting.  If you develop severe abdominal pain, loss of blood in the stool, or nausea/vomiting and are unable to keep fluids down, please seek care in the emergency room.     ED Prescriptions     Medication Sig Dispense Auth. Provider   ondansetron (ZOFRAN-ODT) 4 MG disintegrating tablet Take 1 tablet (4 mg total) by mouth every 8 (eight) hours as needed for nausea or vomiting. 20 tablet Chandra Harlene LABOR, NP      PDMP not reviewed this encounter.   Chandra Harlene LABOR, NP 11/16/23 1001

## 2023-11-16 NOTE — Discharge Instructions (Signed)
 As discussed, your symptoms are consistent with a viral stomach bug.  This should improve over the next 24 to 48 hours.  Continue hydrating with water and sugar-free electrolyte solutions.  You can take the Zofran every 8 hours as needed for nausea/vomiting.  If you develop severe abdominal pain, loss of blood in the stool, or nausea/vomiting and are unable to keep fluids down, please seek care in the emergency room.

## 2023-12-31 ENCOUNTER — Other Ambulatory Visit: Payer: Self-pay | Admitting: Family Medicine

## 2023-12-31 DIAGNOSIS — N63 Unspecified lump in unspecified breast: Secondary | ICD-10-CM

## 2024-02-04 ENCOUNTER — Encounter: Payer: Self-pay | Admitting: Family Medicine

## 2024-02-09 ENCOUNTER — Ambulatory Visit
Admission: RE | Admit: 2024-02-09 | Discharge: 2024-02-09 | Disposition: A | Discharge status: Home / Self Care | Source: Ambulatory Visit | Attending: Family Medicine | Admitting: Family Medicine

## 2024-02-09 ENCOUNTER — Other Ambulatory Visit: Payer: Self-pay | Admitting: Orthopaedic Surgery

## 2024-02-09 DIAGNOSIS — M19071 Primary osteoarthritis, right ankle and foot: Secondary | ICD-10-CM

## 2024-02-09 DIAGNOSIS — N63 Unspecified lump in unspecified breast: Secondary | ICD-10-CM

## 2024-02-12 ENCOUNTER — Ambulatory Visit
Admission: RE | Admit: 2024-02-12 | Discharge: 2024-02-12 | Disposition: A | Discharge status: Home / Self Care | Source: Ambulatory Visit | Attending: Orthopaedic Surgery | Admitting: Orthopaedic Surgery

## 2024-02-12 DIAGNOSIS — M19071 Primary osteoarthritis, right ankle and foot: Secondary | ICD-10-CM

## 2024-05-05 SURGERY — EXCISION, METATARSAL BONE, HEAD
Anesthesia: General | Site: Toe | Laterality: Right

## 2024-05-11 ENCOUNTER — Encounter (HOSPITAL_COMMUNITY): Admission: RE | Payer: Self-pay | Source: Home / Self Care

## 2024-05-11 ENCOUNTER — Ambulatory Visit (HOSPITAL_COMMUNITY): Admission: RE | Admit: 2024-05-11 | Admitting: Orthopaedic Surgery
